# Patient Record
Sex: Female | Born: 1985
Health system: Southern US, Community
[De-identification: ages and names within clinical notes are randomized; demographics above are authoritative.]

## PROBLEM LIST (undated history)

## (undated) DIAGNOSIS — F419 Anxiety disorder, unspecified: Secondary | ICD-10-CM

## (undated) DIAGNOSIS — E559 Vitamin D deficiency, unspecified: Secondary | ICD-10-CM

## (undated) DIAGNOSIS — I10 Essential (primary) hypertension: Secondary | ICD-10-CM

## (undated) DIAGNOSIS — E669 Obesity, unspecified: Secondary | ICD-10-CM

## (undated) DIAGNOSIS — F32A Depression, unspecified: Secondary | ICD-10-CM

## (undated) DIAGNOSIS — F329 Major depressive disorder, single episode, unspecified: Secondary | ICD-10-CM

## (undated) HISTORY — PX: OVARIAN CYST REMOVAL: SHX89

## (undated) HISTORY — DX: Major depressive disorder, single episode, unspecified: F32.9

## (undated) HISTORY — DX: Depression, unspecified: F32.A

## (undated) HISTORY — DX: Anxiety disorder, unspecified: F41.9

## (undated) HISTORY — PX: SPINE SURGERY: SHX786

## (undated) HISTORY — DX: Obesity, unspecified: E66.9

## (undated) HISTORY — DX: Vitamin D deficiency, unspecified: E55.9

---

## 2005-10-05 DIAGNOSIS — L409 Psoriasis, unspecified: Secondary | ICD-10-CM | POA: Insufficient documentation

## 2007-03-07 DIAGNOSIS — J329 Chronic sinusitis, unspecified: Secondary | ICD-10-CM | POA: Insufficient documentation

## 2007-03-08 ENCOUNTER — Emergency Department: Payer: Self-pay | Admitting: Emergency Medicine

## 2008-05-07 ENCOUNTER — Ambulatory Visit: Payer: Self-pay | Admitting: Internal Medicine

## 2008-05-17 ENCOUNTER — Ambulatory Visit: Payer: Self-pay | Admitting: Obstetrics and Gynecology

## 2008-05-18 ENCOUNTER — Ambulatory Visit: Payer: Self-pay | Admitting: Obstetrics and Gynecology

## 2009-07-17 ENCOUNTER — Emergency Department: Payer: Self-pay | Admitting: Emergency Medicine

## 2011-02-02 ENCOUNTER — Ambulatory Visit: Payer: Self-pay | Admitting: Family Medicine

## 2011-04-01 ENCOUNTER — Ambulatory Visit: Payer: Self-pay

## 2011-04-01 LAB — BASIC METABOLIC PANEL
Anion Gap: 9 (ref 7–16)
BUN: 15 mg/dL (ref 7–18)
Chloride: 107 mmol/L (ref 98–107)
Creatinine: 0.85 mg/dL (ref 0.60–1.30)
Potassium: 3.9 mmol/L (ref 3.5–5.1)

## 2011-04-01 LAB — URINALYSIS, COMPLETE
Blood: NEGATIVE
Ketone: NEGATIVE
Leukocyte Esterase: NEGATIVE
Nitrite: NEGATIVE

## 2011-04-01 LAB — CBC WITH DIFFERENTIAL/PLATELET
Basophil #: 0 10*3/uL (ref 0.0–0.1)
Basophil %: 0.5 %
HCT: 40.1 % (ref 35.0–47.0)
HGB: 12.9 g/dL (ref 12.0–16.0)
Lymphocyte #: 1.3 10*3/uL (ref 1.0–3.6)
MCH: 29.1 pg (ref 26.0–34.0)
MCHC: 32.1 g/dL (ref 32.0–36.0)
MCV: 91 fL (ref 80–100)
Monocyte #: 0.4 10*3/uL (ref 0.0–0.7)
Neutrophil #: 2.3 10*3/uL (ref 1.4–6.5)

## 2011-04-01 LAB — LIPASE, BLOOD: Lipase: 143 U/L (ref 73–393)

## 2011-04-03 LAB — URINE CULTURE

## 2011-10-15 ENCOUNTER — Ambulatory Visit: Payer: Self-pay | Admitting: Family Medicine

## 2012-01-29 ENCOUNTER — Other Ambulatory Visit: Payer: Self-pay | Admitting: Family Medicine

## 2012-01-29 LAB — COMPREHENSIVE METABOLIC PANEL
Albumin: 4.3 g/dL (ref 3.4–5.0)
Bilirubin,Total: 0.4 mg/dL (ref 0.2–1.0)
Calcium, Total: 9 mg/dL (ref 8.5–10.1)
Chloride: 106 mmol/L (ref 98–107)
Creatinine: 0.73 mg/dL (ref 0.60–1.30)
EGFR (Non-African Amer.): >60
Glucose: 85 mg/dL (ref 65–99)
Osmolality: 276 (ref 275–301)
Potassium: 3.9 mmol/L (ref 3.5–5.1)
SGOT(AST): 20 U/L (ref 15–37)
Total Protein: 8 g/dL (ref 6.4–8.2)

## 2012-01-29 LAB — CBC WITH DIFFERENTIAL/PLATELET
Basophil %: 0.6 %
HGB: 14.4 g/dL (ref 12.0–16.0)
Lymphocyte #: 0.8 10*3/uL — ABNORMAL LOW (ref 1.0–3.6)
Lymphocyte %: 41.1 %
MCH: 30.3 pg (ref 26.0–34.0)
MCHC: 34.1 g/dL (ref 32.0–36.0)
MCV: 89 fL (ref 80–100)
Monocyte #: 0.2 x10 3/mm (ref 0.2–0.9)
Monocyte %: 10.2 %
Neutrophil #: 0.9 10*3/uL — ABNORMAL LOW (ref 1.4–6.5)
Neutrophil %: 47 %
RBC: 4.74 10*6/uL (ref 3.80–5.20)
RDW: 12.8 % (ref 11.5–14.5)

## 2013-09-12 ENCOUNTER — Ambulatory Visit: Payer: BC Managed Care – PPO | Admitting: Podiatry

## 2013-09-29 ENCOUNTER — Encounter: Payer: Self-pay | Admitting: Podiatry

## 2013-09-29 ENCOUNTER — Ambulatory Visit (INDEPENDENT_AMBULATORY_CARE_PROVIDER_SITE_OTHER): Payer: BC Managed Care – PPO

## 2013-09-29 ENCOUNTER — Ambulatory Visit (INDEPENDENT_AMBULATORY_CARE_PROVIDER_SITE_OTHER): Payer: BC Managed Care – PPO | Admitting: Podiatry

## 2013-09-29 VITALS — BP 145/89 | HR 60 | Resp 16 | Ht 63.0 in | Wt 202.0 lb

## 2013-09-29 DIAGNOSIS — M722 Plantar fascial fibromatosis: Secondary | ICD-10-CM

## 2013-09-29 MED ORDER — TRIAMCINOLONE ACETONIDE 10 MG/ML IJ SUSP
10.0000 mg | Freq: Once | INTRAMUSCULAR | Status: AC
  Administered 2013-09-29: 10 mg

## 2013-09-29 MED ORDER — DICLOFENAC SODIUM 75 MG PO TBEC: 75.0000 mg | DELAYED_RELEASE_TABLET | Freq: Two times a day (BID) | ORAL | Status: DC

## 2013-09-29 NOTE — Progress Notes (Signed)
   Subjective:    Patient ID: Allison Valdez, female    DOB: 06-30-1985, 28 y.o.   MRN: [id number]  HPI Comments: Think i have plantar fasciitis my heel hurts in the morning when i first step down and it hurts so bad after running . It has been about 4or 5 months , it has got worse. It is causing pain in the ankle   Foot Pain Associated symptoms include headaches.      Review of Systems  Neurological: Positive for headaches.  All other systems reviewed and are negative.      Objective:   Physical Exam        Assessment & Plan:

## 2013-09-29 NOTE — Patient Instructions (Signed)

## 2013-09-29 NOTE — Progress Notes (Signed)
Subjective:     Patient ID: Allison Valdez, female   DOB: 1985/01/14, 28 y.o.   MRN: [id number]  Foot Pain   patient presents stating I have a lot of pain in my left heel for the last 4 months. 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      Objective:   Physical Exam  Nursing note and vitals reviewed. Constitutional: She is oriented to person, place, and time.  Cardiovascular: Intact distal pulses.   Musculoskeletal: Normal range of motion.  Neurological: She is oriented to person, place, and time.  Skin: Skin is warm.   neurovascular status intact with muscle strength adequate and range of motion of the subtalar and midtarsal joint. Patient's found to have digits that are well-perfused and range of motion of the first MPJ that adequate. Patient is noted to have exquisite discomfort plantar aspect left heel at the insertion of the tendon into the calcaneus and does have significant depression of the arch upon weightbearing     Assessment:     Severe acute plantar fasciitis left with probable chronic problems with the arch creating stress on the fascia itself    Plan:     H&P and x-rays reviewed. Injected the plantar fascia 3 mg Kenalog 5 mg Xylocaine and applied fascially brace. Prescribed diclofenac 75 mg twice a day and gave instructions on physical therapy

## 2013-10-06 ENCOUNTER — Ambulatory Visit: Payer: BC Managed Care – PPO | Admitting: Podiatry

## 2014-03-14 ENCOUNTER — Ambulatory Visit: Payer: Self-pay | Admitting: Family Medicine

## 2014-06-25 ENCOUNTER — Encounter: Payer: Self-pay | Admitting: *Deleted

## 2014-06-26 ENCOUNTER — Ambulatory Visit (INDEPENDENT_AMBULATORY_CARE_PROVIDER_SITE_OTHER): Payer: BLUE CROSS/BLUE SHIELD | Admitting: *Deleted

## 2014-06-26 VITALS — BP 128/96 | HR 88 | Wt 175.5 lb

## 2014-06-26 DIAGNOSIS — E669 Obesity, unspecified: Secondary | ICD-10-CM

## 2014-06-26 MED ORDER — CYANOCOBALAMIN 1000 MCG/ML IJ SOLN
1000.0000 ug | Freq: Once | INTRAMUSCULAR | Status: AC
  Administered 2014-06-26: 1000 ug via INTRAMUSCULAR

## 2014-07-23 ENCOUNTER — Ambulatory Visit (INDEPENDENT_AMBULATORY_CARE_PROVIDER_SITE_OTHER): Payer: BLUE CROSS/BLUE SHIELD | Admitting: Obstetrics and Gynecology

## 2014-07-23 VITALS — BP 123/83 | HR 67 | Ht 63.0 in | Wt 170.4 lb

## 2014-07-23 DIAGNOSIS — E669 Obesity, unspecified: Secondary | ICD-10-CM

## 2014-07-23 MED ORDER — CYANOCOBALAMIN 1000 MCG/ML IJ SOLN
1000.0000 ug | Freq: Once | INTRAMUSCULAR | Status: AC
  Administered 2014-07-23: 1000 ug via INTRAMUSCULAR

## 2014-07-23 NOTE — Progress Notes (Cosign Needed)
Pt presents today for 1 month f/u on weight, BP, and B-12 injection. Pt denies complaints. Pt given 1 mL injection of cyanocobalamin in the RT deltoid. Pt tolerated well. Pt advised to f/u in 1 month with provider as she is due for a 3 month evaluation of weight loss management.

## 2014-07-23 NOTE — Patient Instructions (Signed)
Pt instructed to f/u in 1 month for 3 month evaluation of weight management.

## 2014-08-07 ENCOUNTER — Ambulatory Visit (INDEPENDENT_AMBULATORY_CARE_PROVIDER_SITE_OTHER): Payer: BLUE CROSS/BLUE SHIELD | Admitting: Obstetrics and Gynecology

## 2014-08-07 ENCOUNTER — Encounter: Payer: Self-pay | Admitting: Obstetrics and Gynecology

## 2014-08-07 VITALS — BP 113/81 | HR 78 | Ht 63.0 in | Wt 172.0 lb

## 2014-08-07 DIAGNOSIS — E559 Vitamin D deficiency, unspecified: Secondary | ICD-10-CM | POA: Diagnosis not present

## 2014-08-07 MED ORDER — PHENTERMINE HCL 37.5 MG PO TABS: 37.5000 mg | ORAL_TABLET | Freq: Every day | ORAL | Status: DC

## 2014-08-07 NOTE — Progress Notes (Signed)
Subjective:     Patient ID: Allison Valdez, female   DOB: [birth year], 29 y.o.   MRN: [id number]  HPI Has been on weight loss plan here since Oct 2015 and lost 30+ lbs  Review of Systems Review of Systems - General ROS: negative    Objective:   Physical Exam A&O x4 Well groomed female in no distress PE not indicated    Assessment:     Obesity Vitamin d deficiency     Plan:     Restart weight loss medications RTC 4 weeks Vit D lab obtained  Yolanda Bonine, CNM

## 2014-08-08 LAB — VITAMIN D 25 HYDROXY (VIT D DEFICIENCY, FRACTURES): VIT D 25 HYDROXY: 27.6 ng/mL — AB (ref 30.0–100.0)

## 2014-08-09 ENCOUNTER — Other Ambulatory Visit: Payer: Self-pay | Admitting: Obstetrics and Gynecology

## 2014-08-09 MED ORDER — ERGOCALCIFEROL 1.25 MG (50000 UT) PO CAPS: 50000.0000 [IU] | ORAL_CAPSULE | ORAL | Status: DC

## 2014-08-10 ENCOUNTER — Telehealth: Payer: Self-pay | Admitting: *Deleted

## 2014-08-10 NOTE — Telephone Encounter (Signed)
-----   Message from Ulyses Amor, [place] sent at 08/09/2014 12:25 PM EDT ----- Please let her know her vit d is still low- recommend weekly supplements which will bring it up faster, x 6 months, then can go to OTC daily. Rx sent in

## 2014-08-10 NOTE — Telephone Encounter (Signed)
Notified pt of lab results, rx sent into pharmacy

## 2014-09-04 ENCOUNTER — Ambulatory Visit: Payer: BLUE CROSS/BLUE SHIELD

## 2014-09-06 ENCOUNTER — Ambulatory Visit (INDEPENDENT_AMBULATORY_CARE_PROVIDER_SITE_OTHER): Payer: BLUE CROSS/BLUE SHIELD | Admitting: Obstetrics and Gynecology

## 2014-09-06 VITALS — BP 123/85 | HR 67 | Ht 63.0 in | Wt 159.4 lb

## 2014-09-06 DIAGNOSIS — E663 Overweight: Secondary | ICD-10-CM

## 2014-09-06 MED ORDER — CYANOCOBALAMIN 1000 MCG/ML IJ SOLN
1000.0000 ug | Freq: Once | INTRAMUSCULAR | Status: AC
  Administered 2014-09-06: 1000 ug via INTRAMUSCULAR

## 2014-09-06 NOTE — Progress Notes (Cosign Needed)
Patient ID: Allison Valdez, female   DOB: 1985-02-13, 29 y.o.   MRN: [id number] Pt has lost 12 lbs since last visit! No problems with medication side effects.

## 2014-10-04 ENCOUNTER — Ambulatory Visit: Payer: BLUE CROSS/BLUE SHIELD

## 2014-10-08 ENCOUNTER — Ambulatory Visit: Payer: BLUE CROSS/BLUE SHIELD

## 2015-02-20 ENCOUNTER — Encounter: Payer: Self-pay | Admitting: Obstetrics and Gynecology

## 2015-02-20 ENCOUNTER — Ambulatory Visit (INDEPENDENT_AMBULATORY_CARE_PROVIDER_SITE_OTHER): Payer: BLUE CROSS/BLUE SHIELD | Admitting: Obstetrics and Gynecology

## 2015-02-20 VITALS — BP 122/70 | HR 62 | Wt 176.2 lb

## 2015-02-20 DIAGNOSIS — E669 Obesity, unspecified: Secondary | ICD-10-CM

## 2015-02-20 MED ORDER — PHENTERMINE HCL 37.5 MG PO TABS: 37.5000 mg | ORAL_TABLET | Freq: Every day | ORAL | Status: DC

## 2015-02-20 MED ORDER — CYANOCOBALAMIN 1000 MCG/ML IJ SOLN: 1000.0000 ug | Freq: Once | INTRAMUSCULAR | Status: DC

## 2015-02-20 MED ORDER — ERGOCALCIFEROL 1.25 MG (50000 UT) PO CAPS: 50000.0000 [IU] | ORAL_CAPSULE | ORAL | Status: DC

## 2015-02-20 NOTE — Progress Notes (Signed)
SUBJECTIVE:  30 y.o. here for follow-up weight loss visit, previously seen 4 weeks ago. Denies any concerns and feels like medication worked well in past, has been off meds since Nov. 2016 and did well until christmas and start of year, has regained 37 # and desires restart meds.  OBJECTIVE:  BP 122/70 mmHg  Pulse 62  Wt 176 lb 3 oz (79.918 kg)  Body mass index is 31.22 kg/(m^2). Patient appears well. ASSESSMENT:  Obesity- responding well to weight loss plan PLAN:  To restart current medications and RX given.  Also want her to restart VitD weekly supplements. B12 1044mcg/ml injection given RTC in 4 weeks as planned  Melody Farwell, CNM

## 2015-02-20 NOTE — Progress Notes (Signed)
Patient ID: Allison Valdez, female   DOB: 1985/07/16, 30 y.o.   MRN: [id number] Pt would like to discuss weight management. Weight gain of 16.7 lbs since 08/2014.

## 2015-02-22 ENCOUNTER — Ambulatory Visit: Payer: BLUE CROSS/BLUE SHIELD | Admitting: Obstetrics and Gynecology

## 2015-03-20 ENCOUNTER — Ambulatory Visit: Payer: BLUE CROSS/BLUE SHIELD

## 2015-10-21 ENCOUNTER — Encounter: Payer: Self-pay | Admitting: Physician Assistant

## 2015-10-21 ENCOUNTER — Ambulatory Visit (INDEPENDENT_AMBULATORY_CARE_PROVIDER_SITE_OTHER): Payer: BLUE CROSS/BLUE SHIELD | Admitting: Physician Assistant

## 2015-10-21 VITALS — BP 122/90 | HR 64 | Temp 98.3°F | Resp 16 | Ht 63.0 in | Wt 190.0 lb

## 2015-10-21 DIAGNOSIS — E6609 Other obesity due to excess calories: Secondary | ICD-10-CM

## 2015-10-21 DIAGNOSIS — Z6831 Body mass index (BMI) 31.0-31.9, adult: Secondary | ICD-10-CM

## 2015-10-21 DIAGNOSIS — Z713 Dietary counseling and surveillance: Secondary | ICD-10-CM | POA: Diagnosis not present

## 2015-10-21 MED ORDER — PHENTERMINE HCL 37.5 MG PO TABS: 37.5000 mg | ORAL_TABLET | Freq: Every day | ORAL | 0 refills | Status: DC

## 2015-10-21 NOTE — Progress Notes (Signed)
Patient: Allison Valdez Female    DOB: May 10, 1985   30 y.o.   MRN: [id number] Visit Date: 10/21/2015  Today's Provider: Margaretann LovelessJennifer M Glynna Failla, PA-C   Chief Complaint  Patient presents with  . Obesity   Subjective:    HPI     Follow up for Obesity  Pt was being followed by OB/GYN for this. Last saw specialist for this problem on 02/20/2015. Was taking Phentermine, which helped pt to lose a total of 50 lbs. Pt reports she has since gained 30 lbs since May. Weighs 190lb today. Admits she stress eats. Tried to eat healthy. Eats breakfast, snack, lunch, snack, incorporates protein into diet. Reports she does "go crazy" with eating when she gets home from work. Likes to snack on potato chips, etc at home. Keeps a 30 YO, and reports she likes to eat the child's snack foods. Is exercising. Typically runs 3-4 times per week. Just participated in a 30 mile event about 3 weeks ago. Has had a H/O obesity. Pt's highest weight was 250 in 2009. Pt states her lowest adult weight was 157. Weight has fluctuated her entire adult life. --------------------------------------------------------------------------------  No Known Allergies   Current Outpatient Prescriptions:  .  levonorgestrel (MIRENA) 20 MCG/24HR IUD, 1 each by Intrauterine route once., Disp: , Rfl:   Review of Systems  Constitutional: Negative.   Respiratory: Negative.   Cardiovascular: Negative.   Gastrointestinal: Negative.   Neurological: Negative.   Psychiatric/Behavioral: Negative.     Social History  Substance Use Topics  . Smoking status: Never Smoker  . Smokeless tobacco: Never Used  . Alcohol use Yes     Comment: occas   Objective:   BP 122/90 (BP Location: Left Arm, Patient Position: Sitting, Cuff Size: Large)   Pulse 64   Temp 98.3 F (36.8 C) (Oral)   Resp 16   Ht 5\' 3"  (1.6 m)   Wt 190 lb (86.2 kg)   BMI 33.66 kg/m   Physical Exam  Constitutional: She appears well-developed and well-nourished.  No distress.  Neck: Normal range of motion. Neck supple. No tracheal deviation present. No thyromegaly present.  Cardiovascular: Normal rate, regular rhythm and normal heart sounds.  Exam reveals no gallop and no friction rub.   No murmur heard. Pulmonary/Chest: Effort normal and breath sounds normal. No respiratory distress. She has no wheezes. She has no rales.  Musculoskeletal: She exhibits no edema.  Lymphadenopathy:    She has no cervical adenopathy.  Skin: She is not diaphoretic.  Psychiatric: She has a normal mood and affect. Her behavior is normal. Judgment and thought content normal.  Vitals reviewed.     Assessment & Plan:     1. Class 1 obesity due to excess calories without serious comorbidity with body mass index (BMI) of 31.0 to 31.9 in adult Has previously used phentermine successfully and kept weight off for 2 years. Recently started regaining weight over last year. She continues to exercise regularly. Does try to eat healthier but is not currently using a food diary. She has used a food diary in the past successfully and states she lost her most amount of weight (almost 100 pounds) using phentermine with a food diary. Will give phentermine for appetite suppression. Start food diary and try to adhere to 1200-1400 calories per day. I will see her back in 4 weeks for weight check. - phentermine (ADIPEX-P) 37.5 MG tablet; Take 1 tablet (37.5 mg total) by mouth daily before breakfast.  Dispense: 30 tablet; Refill: 0  2. Encounter for weight loss counseling See above medical treatment plan.       Margaretann Loveless, PA-C  [facility]

## 2015-10-21 NOTE — Patient Instructions (Signed)

## 2015-11-18 ENCOUNTER — Encounter: Payer: Self-pay | Admitting: Physician Assistant

## 2015-11-18 ENCOUNTER — Ambulatory Visit (INDEPENDENT_AMBULATORY_CARE_PROVIDER_SITE_OTHER): Payer: BLUE CROSS/BLUE SHIELD | Admitting: Physician Assistant

## 2015-11-18 DIAGNOSIS — Z6831 Body mass index (BMI) 31.0-31.9, adult: Secondary | ICD-10-CM

## 2015-11-18 DIAGNOSIS — E6609 Other obesity due to excess calories: Secondary | ICD-10-CM | POA: Diagnosis not present

## 2015-11-18 MED ORDER — PHENTERMINE HCL 37.5 MG PO TABS: 37.5000 mg | ORAL_TABLET | Freq: Every day | ORAL | 0 refills | Status: DC

## 2015-11-18 NOTE — Patient Instructions (Signed)

## 2015-11-18 NOTE — Progress Notes (Signed)
       Patient: Allison Valdez Female    DOB: [birth year]   30 y.o.   MRN: [id number] Visit Date: 11/18/2015  Today's Provider: Margaretann LovelessJennifer M Burnette, PA-C   Chief Complaint  Patient presents with  . Follow-up   Subjective:    HPI  Follow up for weight loss  The patient was last seen for this 4 weeks ago. Changes made at last visit include no changes.  She reports excellent compliance with treatment. She feels that condition is Improved. She is not having side effects.  She was 190 pounds when starting and is down to 182 pounds today.  Current Exercise Habits: Home exercise routine, Type of exercise: walking, Time (Minutes): 45, Frequency (Times/Week): 4, Weekly Exercise (Minutes/Week): 180, Intensity: Mild   ------------------------------------------------------------------------------    No Known Allergies   Current Outpatient Prescriptions:  .  levonorgestrel (MIRENA) 20 MCG/24HR IUD, 1 each by Intrauterine route once., Disp: , Rfl:  .  phentermine (ADIPEX-P) 37.5 MG tablet, Take 1 tablet (37.5 mg total) by mouth daily before breakfast., Disp: 30 tablet, Rfl: 0  Review of Systems  Constitutional: Negative.   Respiratory: Negative.   Cardiovascular: Negative.   Gastrointestinal: Negative.   Psychiatric/Behavioral: Negative.     Social History  Substance Use Topics  . Smoking status: Never Smoker  . Smokeless tobacco: Never Used  . Alcohol use Yes     Comment: occas   Objective:   BP 120/80 (BP Location: Right Arm, Patient Position: Sitting, Cuff Size: Large)   Pulse 68   Temp 98.5 F (36.9 C) (Oral)   Resp 16   Ht 5\' 3"  (1.6 m)   Wt 182 lb (82.6 kg)   SpO2 97%   BMI 32.24 kg/m   Physical Exam  Constitutional: She appears well-developed and well-nourished. No distress.  Neck: Normal range of motion. Neck supple. No tracheal deviation present. No thyromegaly present.  Cardiovascular: Normal rate, regular rhythm and normal heart sounds.  Exam  reveals no gallop and no friction rub.   No murmur heard. Pulmonary/Chest: Effort normal and breath sounds normal. No respiratory distress. She has no wheezes. She has no rales.  Lymphadenopathy:    She has no cervical adenopathy.  Skin: She is not diaphoretic.  Psychiatric: She has a normal mood and affect. Her behavior is normal. Judgment and thought content normal.  Vitals reviewed.     Assessment & Plan:     1. Class 1 obesity due to excess calories without serious comorbidity with body mass index (BMI) of 31.0 to 31.9 in adult Doing very well and down 8 pounds over the last month. Continue exercise. Discussed importance of food diary and she agrees to start one back up. I will see her back in 4 weeks to see how she is progressing. If she is still doing well I will then give her 3 months. - phentermine (ADIPEX-P) 37.5 MG tablet; Take 1 tablet (37.5 mg total) by mouth daily before breakfast.  Dispense: 30 tablet; Refill: 0       Margaretann LovelessJennifer M Burnette, PA-C  [facility]

## 2015-12-16 ENCOUNTER — Ambulatory Visit: Payer: BLUE CROSS/BLUE SHIELD | Admitting: Physician Assistant

## 2015-12-26 ENCOUNTER — Ambulatory Visit: Payer: BLUE CROSS/BLUE SHIELD | Admitting: Physician Assistant

## 2016-02-17 ENCOUNTER — Encounter: Payer: Self-pay | Admitting: Physician Assistant

## 2016-02-17 ENCOUNTER — Ambulatory Visit (INDEPENDENT_AMBULATORY_CARE_PROVIDER_SITE_OTHER): Payer: BLUE CROSS/BLUE SHIELD | Admitting: Physician Assistant

## 2016-02-17 DIAGNOSIS — E6609 Other obesity due to excess calories: Secondary | ICD-10-CM

## 2016-02-17 DIAGNOSIS — Z6831 Body mass index (BMI) 31.0-31.9, adult: Secondary | ICD-10-CM | POA: Diagnosis not present

## 2016-02-17 MED ORDER — PHENTERMINE HCL 37.5 MG PO TABS: 37.5000 mg | ORAL_TABLET | Freq: Every day | ORAL | 0 refills | Status: DC

## 2016-02-17 NOTE — Patient Instructions (Signed)

## 2016-02-17 NOTE — Progress Notes (Signed)
Patient: Allison Valdez Female    DOB: 03-15-1985   31 y.o.   MRN: [id number] Visit Date: 02/17/2016  Today's Provider: Margaretann LovelessJennifer M Burnette, PA-C   Chief Complaint  Patient presents with  . Follow-up   Subjective:    HPI  Follow up for weight loss  The patient was last seen for this 3 months ago. Changes made at last visit include no changes.  She reports poor compliance with treatment. Patient reports that she has been out of medication since December. Patient reports that she does exercise and eats well during work hours then seem to lose control in the evening.  She feels that condition is Worse. She is not having side effects.   She does report that she has been struggling with nighttime snacking. She does still do a food diary but only during the day at work and then she stops when she gets home. She has been continuing to exercise as well. She is doing the 21-day fix exercise DVDs. ------------------------------------------------------------------------------------     No Known Allergies There are no active problems to display for this patient.    Current Outpatient Prescriptions:  .  levonorgestrel (MIRENA) 20 MCG/24HR IUD, 1 each by Intrauterine route once., Disp: , Rfl:  .  phentermine (ADIPEX-P) 37.5 MG tablet, Take 1 tablet (37.5 mg total) by mouth daily before breakfast. (Patient not taking: Reported on 02/17/2016), Disp: 30 tablet, Rfl: 0  Review of Systems  Constitutional: Negative.   Respiratory: Negative.   Cardiovascular: Negative.   Neurological: Negative.     Social History  Substance Use Topics  . Smoking status: Never Smoker  . Smokeless tobacco: Never Used  . Alcohol use Yes     Comment: occas   Objective:   BP 130/80 (BP Location: Left Arm, Patient Position: Sitting, Cuff Size: Large)   Pulse 72   Temp 98.5 F (36.9 C) (Oral)   Resp 16   Ht 5' 2.3" (1.582 m)   Wt 208 lb (94.3 kg)   SpO2 99%   BMI 37.68 kg/m   Physical  Exam  Constitutional: She appears well-developed and well-nourished. No distress.  Neck: Normal range of motion. Neck supple. No tracheal deviation present. No thyromegaly present.  Cardiovascular: Normal rate, regular rhythm and normal heart sounds.  Exam reveals no gallop and no friction rub.   No murmur heard. Pulmonary/Chest: Effort normal and breath sounds normal. No respiratory distress. She has no wheezes. She has no rales.  Musculoskeletal: She exhibits no edema.  Lymphadenopathy:    She has no cervical adenopathy.  Skin: She is not diaphoretic.  Psychiatric: She has a normal mood and affect. Her behavior is normal. Judgment and thought content normal.  Vitals reviewed.     Assessment & Plan:     1. Class 1 obesity due to excess calories without serious comorbidity with body mass index (BMI) of 31.0 to 31.9 in adult Patient did have a 20 pound weight gain being off phentermine for 2 months and through the holidays. Really emphasized for her to try to complete the food diary for the whole day and to try to eat more at work to balance her diet better. Discussed increasing water intake at night to cut appetite. Also discussed setting a hard time like 7pm to not have any food or drink after that time. She agrees. Discussed that phentermine will only help her decrease her portion size and control appetite, that she needs to be strong  with setting lifestyle changes as well or she will continue to gain weight every time she discontinues the medication. She agrees. I will see her back in 4 weeks to check her progress.  - phentermine (ADIPEX-P) 37.5 MG tablet; Take 1 tablet (37.5 mg total) by mouth daily before breakfast.  Dispense: 30 tablet; Refill: 0       Margaretann Loveless, PA-C  [facility]

## 2016-03-16 ENCOUNTER — Encounter: Payer: Self-pay | Admitting: Physician Assistant

## 2016-03-16 ENCOUNTER — Ambulatory Visit (INDEPENDENT_AMBULATORY_CARE_PROVIDER_SITE_OTHER): Payer: BLUE CROSS/BLUE SHIELD | Admitting: Physician Assistant

## 2016-03-16 VITALS — BP 116/84 | HR 77 | Temp 98.2°F | Resp 16 | Ht 62.5 in | Wt 201.0 lb

## 2016-03-16 DIAGNOSIS — E6609 Other obesity due to excess calories: Secondary | ICD-10-CM | POA: Diagnosis not present

## 2016-03-16 DIAGNOSIS — R635 Abnormal weight gain: Secondary | ICD-10-CM | POA: Insufficient documentation

## 2016-03-16 DIAGNOSIS — F419 Anxiety disorder, unspecified: Secondary | ICD-10-CM | POA: Insufficient documentation

## 2016-03-16 DIAGNOSIS — M7631 Iliotibial band syndrome, right leg: Secondary | ICD-10-CM

## 2016-03-16 DIAGNOSIS — F4323 Adjustment disorder with mixed anxiety and depressed mood: Secondary | ICD-10-CM | POA: Insufficient documentation

## 2016-03-16 DIAGNOSIS — Z6831 Body mass index (BMI) 31.0-31.9, adult: Secondary | ICD-10-CM

## 2016-03-16 MED ORDER — PHENTERMINE HCL 37.5 MG PO TABS: 37.5000 mg | ORAL_TABLET | Freq: Every day | ORAL | 2 refills | Status: DC

## 2016-03-16 NOTE — Progress Notes (Signed)
Patient: Allison Valdez Female    DOB: [birth year]   30 y.o.   MRN: [id number] Visit Date: 03/16/2016  Today's Provider: Margaretann LovelessJennifer M Burnette, PA-C   Chief Complaint  Patient presents with  . Obesity  . Hip Pain    right   Subjective:    HPI  Follow up for obesity  The patient was last seen for this 1 months ago. Changes made at last visit include no changes continue phentermine.  She reports excellent compliance with treatment. She feels that condition is Improved. She is not having side effects.  ------------------------------------------------------------------------------------ Patient c/o right hip pain x's 1 week, reports pain is worse after running. Patient reports taking OTC Tylenol and Ibuprofen and reports no improvement with medications.     No Known Allergies   Current Outpatient Prescriptions:  .  levonorgestrel (MIRENA) 20 MCG/24HR IUD, 1 each by Intrauterine route once., Disp: , Rfl:  .  phentermine (ADIPEX-P) 37.5 MG tablet, Take 1 tablet (37.5 mg total) by mouth daily before breakfast., Disp: 30 tablet, Rfl: 0  Review of Systems  Constitutional: Negative.   Respiratory: Negative.   Cardiovascular: Negative.   Gastrointestinal: Negative.   Musculoskeletal: Positive for arthralgias.  Psychiatric/Behavioral: Negative.     Social History  Substance Use Topics  . Smoking status: Never Smoker  . Smokeless tobacco: Never Used  . Alcohol use Yes     Comment: occas   Objective:   BP 116/84 (BP Location: Right Arm, Patient Position: Sitting, Cuff Size: Large)   Pulse 77   Temp 98.2 F (36.8 C) (Oral)   Resp 16   Ht 5' 2.5" (1.588 m)   Wt 201 lb (91.2 kg)   SpO2 98%   BMI 36.18 kg/m  Vitals:   03/16/16 0836  BP: 116/84  Pulse: 77  Resp: 16  Temp: 98.2 F (36.8 C)  TempSrc: Oral  SpO2: 98%  Weight: 201 lb (91.2 kg)  Height: 5' 2.5" (1.588 m)     Physical Exam  Constitutional: She appears well-developed and well-nourished.  No distress.  Neck: Normal range of motion. Neck supple. No JVD present. No tracheal deviation present. No thyromegaly present.  Cardiovascular: Normal rate, regular rhythm and normal heart sounds.  Exam reveals no gallop and no friction rub.   No murmur heard. Pulmonary/Chest: Effort normal and breath sounds normal. No respiratory distress. She has no wheezes. She has no rales.  Musculoskeletal:       Right hip: Normal. She exhibits no tenderness and no bony tenderness.       Right knee: Normal.  Feels pull over right lateral iliac crest and lateral thigh to knee with running; not bothering her now.  Lymphadenopathy:    She has no cervical adenopathy.  Skin: She is not diaphoretic.  Vitals reviewed.     Assessment & Plan:     1. Class 1 obesity due to excess calories without serious comorbidity with body mass index (BMI) of 31.0 to 31.9 in adult She continues to do well and has lost another 7 pounds since last weigh in. She is to continue food diary and exercise. She is doing well. Will continue phentermine for 3 more months and recheck weight in that time frame.  - phentermine (ADIPEX-P) 37.5 MG tablet; Take 1 tablet (37.5 mg total) by mouth daily before breakfast.  Dispense: 30 tablet; Refill: 2  2. It band syndrome, right Discussed stretching, massages and slowing down running when she feels the  pain. May benefit from epsom salt soaks and biofreeze massages.        Margaretann Loveless, PA-C  [facility]

## 2016-03-16 NOTE — Patient Instructions (Signed)
    Iliotibial Band Syndrome Iliotibial band syndrome (ITBS) is a condition that often causes knee pain. It can also cause pain in the outside of your hip, thigh, and knee. The iliotibial band is a strip of tissue that runs from the outside of your hip and down your thigh to the outside of your knee. Repeatedly bending and straightening your knee can irritate the iliotibial band. What are the causes? This condition is caused by inflammation and irritation from the friction of the iliotibial band moving over the thigh bone (femur) when you repeatedly bend and straighten your knee. What increases the risk? This condition is more likely to develop in people who:  Frequently change elevation during their workouts.  Run very long distances.  Recently increased the length or intensity of their workouts.  Run downhill often, or just started running downhill.  Ride a bike very far or often. You may also be at greater risk if you start a new workout routine without first warming up or if you have a job that requires you to bend, squat, or climb frequently. What are the signs or symptoms? Symptoms of this condition include:  Pain along the outside of your knee that may be worse with activity, especially running or going up and down stairs.  A "snapping" sensation over your knee.  Swelling on the outside of your knee.  Pain or a feeling of tightness in your hip. How is this diagnosed? This condition is diagnosed based on your symptoms, medical history, and physical exam. You may also see a health care provider who specializes in reducing pain and increasing mobility (physical therapist). A physical therapist may do an exam to check your balance, movement, and way of walking or running (gait) to see whether the way you move could contribute to your injury. You may also have tests to measure your strength, flexibility, and range of motion. How is this treated? Treatment for this condition  includes:  Resting and limiting exercise.  Returning to activities gradually.  Doing range-of-motion and strengthening exercises (physical therapy) as told by your health care provider.  Including low-impact activities, such as swimming, in your exercise routine. Follow these instructions at home:  If directed, apply ice to the injured area.  Put ice in a plastic bag.  Place a towel between your skin and the bag.  Leave the ice on for 20 minutes, 2-3 times per day.  Return to your normal activities as told by your health care provider. Ask your health care provider what activities are safe for you.  Keep all follow-up visits with your health care provider. This is important. Contact a health care provider if:  Your pain does not improve or gets worse despite treatment. This information is not intended to replace advice given to you by your health care provider. Make sure you discuss any questions you have with your health care provider. Document Released: 06/20/2001 Document Revised: 12/23/2015 Document Reviewed: 05/31/2015 Elsevier Interactive Patient Education  2017 [employer]  

## 2016-06-15 ENCOUNTER — Ambulatory Visit: Payer: BLUE CROSS/BLUE SHIELD | Admitting: Physician Assistant

## 2016-10-06 DIAGNOSIS — L720 Epidermal cyst: Secondary | ICD-10-CM | POA: Diagnosis not present

## 2016-10-26 DIAGNOSIS — L720 Epidermal cyst: Secondary | ICD-10-CM | POA: Diagnosis not present

## 2016-11-10 DIAGNOSIS — Z0000 Encounter for general adult medical examination without abnormal findings: Secondary | ICD-10-CM | POA: Diagnosis not present

## 2016-11-10 DIAGNOSIS — Z6841 Body Mass Index (BMI) 40.0 and over, adult: Secondary | ICD-10-CM | POA: Insufficient documentation

## 2016-11-10 DIAGNOSIS — Z7689 Persons encountering health services in other specified circumstances: Secondary | ICD-10-CM | POA: Diagnosis not present

## 2016-12-16 DIAGNOSIS — Z124 Encounter for screening for malignant neoplasm of cervix: Secondary | ICD-10-CM | POA: Diagnosis not present

## 2016-12-16 DIAGNOSIS — Z6841 Body Mass Index (BMI) 40.0 and over, adult: Secondary | ICD-10-CM | POA: Diagnosis not present

## 2016-12-16 DIAGNOSIS — Z0000 Encounter for general adult medical examination without abnormal findings: Secondary | ICD-10-CM | POA: Diagnosis not present

## 2017-08-05 ENCOUNTER — Encounter: Payer: Self-pay | Admitting: Obstetrics and Gynecology

## 2017-08-05 ENCOUNTER — Ambulatory Visit (INDEPENDENT_AMBULATORY_CARE_PROVIDER_SITE_OTHER): Payer: BLUE CROSS/BLUE SHIELD | Admitting: Obstetrics and Gynecology

## 2017-08-05 VITALS — BP 130/90 | HR 88 | Ht 63.0 in | Wt 230.7 lb

## 2017-08-05 DIAGNOSIS — E6609 Other obesity due to excess calories: Secondary | ICD-10-CM | POA: Diagnosis not present

## 2017-08-05 DIAGNOSIS — M25551 Pain in right hip: Secondary | ICD-10-CM

## 2017-08-05 DIAGNOSIS — M25552 Pain in left hip: Secondary | ICD-10-CM | POA: Diagnosis not present

## 2017-08-05 DIAGNOSIS — Z6831 Body mass index (BMI) 31.0-31.9, adult: Secondary | ICD-10-CM

## 2017-08-05 MED ORDER — PHENTERMINE HCL 37.5 MG PO TABS: 37.5000 mg | ORAL_TABLET | Freq: Every day | ORAL | 2 refills | Status: DC

## 2017-08-05 MED ORDER — CYANOCOBALAMIN 1000 MCG/ML IJ SOLN: 1000.0000 ug | Freq: Once | INTRAMUSCULAR | 1 refills | Status: AC

## 2017-08-05 NOTE — Progress Notes (Signed)
Subjective:  Allison Valdez is a 32 y.o. G0P0000 at Unknown being seen today for weight loss management- initial visit.  Patient reports General ROS: negative and reports previous weight loss attempts:successful until last year when hip pain started.  Associated symptoms include: fatigue, depression, hip and feet pain since gaining weight. Previous/Current treatment includes: small frequent feedings, , vitamin B-12 injections and appetite suppresant.   The patient has a surgical history of: ovarian cyst removed.   Past evaluation has included: metabolic profile, hemoglobin A1c.     The following portions of the patient's history were reviewed and updated as appropriate: allergies, current medications, past family history, past medical history, past social history, past surgical history and problem list.   Objective:   Vitals:   08/05/17 1338  BP: 130/90  Pulse: 88  Weight: 230 lb 11.2 oz (104.6 kg)  Height: 5\' 3"  (1.6 m)    General:  Alert, oriented and cooperative. Patient is in no acute distress.  :   :   :   :   :   :   PE: Well groomed female in no current distress,   Mental Status: Normal mood and affect. Normal behavior. Normal judgment and thought content.   Current BMI: Body mass index is 40.87 kg/m.   Assessment and Plan:  Obesity  There are no diagnoses linked to this encounter.  Plan: low carb, High protein diet RX for adipex 37.5 mg daily and B12 1000mcg.ml monthly, to start now with first injection given at today's visit. Reviewed side-effects common to both medications and expected outcomes. Increase daily water intake to at least 8 bottle a day, every day.  Goal is to reduse weight by 10% by end of three months, and will re-evaluate then.  RTC in 4 weeks for Nurse visit to check weight & BP, and get next B12 injections.    Please refer to After Visit Summary for other counseling recommendations.    [place], Allison Valdez, CNM   Allison Valdez  Allison Valdez, CNM     2. bilateral Hip pain after exercise  Referred to Antoine PrimasZachary Smith in [place] for evaluation   Consider the Low Glycemic Index Diet and 6 smaller meals daily .  This boosts your metabolism and regulates your sugars:   Use the protein bar by Atkins because they have lots of fiber in them  Find the low carb flatbreads, tortillas and pita breads for sandwiches:  Joseph's makes a pita bread and a flat bread , available at [employer] and [employer]; Toufayah makes a low carb flatbread available at [employer] and HT that is 9 net carbs and 100 cal Mission makes a low carb whole wheat tortilla available at [employer],and most grocery stores with 6 net carbs and 210 cal  [place] yogurt can still have a lot of carbs .  Dannon Light Valdez fit has 80 cal and 8 carbs

## 2017-08-05 NOTE — Patient Instructions (Addendum)
low carb, High protein diet RX for adipex 37.5 mg daily and B12 1000mcg.ml monthly, to start now with first injection given at today's visit. Reviewed side-effects common to both medications and expected outcomes. Increase daily water intake to at least 8 bottle a day, every day.  Goal is to reduse weight by 10% by end of three months, and will re-evaluate then.  RTC in 4 weeks for Nurse visit to check weight & BP, and get next B12 injections. .  This boosts your metabolism and regulates your sugars:   Use the protein bar by Atkins because they have lots of fiber in them  Find the low carb flatbreads, tortillas and pita breads for sandwiches:  Joseph's makes a pita bread and a flat bread , available at [facility] and [employer]; Toufayah makes a low carb flatbread available at [employer] and HT that is 9 net carbs and 100 cal Mission makes a low carb whole wheat tortilla available at [employer],and most grocery stores with 6 net carbs and 210 cal  [place] yogurt can still have a lot of carbs .  Dannon Light N fit has 80 cal and 8 carbs

## 2017-08-07 NOTE — Progress Notes (Signed)
[facility] [address] [place], [place] [address] Phone: [phone] Subjective:   Referred by provider Melody Shambley  CC: bilateral hip pain   BJY:NWGNFAOZHY  Allison Valdez is a 32 y.o. female coming in with complaint of bilateral hip pain and feet pain. Chronic hip pain. States that she is a runner but has slowed down a lot due to pain. After exercises she hurts for days after. No numbness and tingling to the toes. Sharp pain that is deep. Last week she has had sharp pains in her lower back. Has tried oral meds for pain.  Patient is having pain that seems to be on the lateral aspect of the hips.  Seems worse after sitting for long amount of time.  Rates the severity pain is 7 out of 10   Patient did have x-rays taken 4 years ago.  These were independently visualized by me showing some mild L5-S1 narrowing but otherwise unremarkable  Past Medical History:  Diagnosis Date  . Anxiety   . Depression   . Obesity   . Vitamin D deficiency disease    Past Surgical History:  Procedure Laterality Date  . OVARIAN CYST REMOVAL Right    Social History   Socioeconomic History  . Marital status: Married    Spouse name: Not on file  . Number of children: Not on file  . Years of education: Not on file  . Highest education level: Not on file  Occupational History  . Not on file  Social Needs  . Financial resource strain: Not on file  . Food insecurity:    Worry: Not on file    Inability: Not on file  . Transportation needs:    Medical: Not on file    Non-medical: Not on file  Tobacco Use  . Smoking status: Never Smoker  . Smokeless tobacco: Never Used  Substance and Sexual Activity  . Alcohol use: Yes    Comment: occas  . Drug use: No  . Sexual activity: Yes    Birth control/protection: IUD    Comment: Mirena  Lifestyle  . Physical activity:    Days per week: Not on file    Minutes per session: Not on file  . Stress: Not on file    Relationships  . Social connections:    Talks on phone: Not on file    Gets together: Not on file    Attends religious service: Not on file    Active member of club or organization: Not on file    Attends meetings of clubs or organizations: Not on file    Relationship status: Not on file  Other Topics Concern  . Not on file  Social History Narrative  . Not on file   No Known Allergies Family History  Problem Relation Age of Onset  . Diabetes Mother   . Diabetes Father      Past medical history, social, surgical and family history all reviewed in electronic medical record.  No pertanent information unless stated regarding to the chief complaint.   Review of Systems:Review of systems updated and as accurate as of 08/09/17  No headache, visual changes, nausea, vomiting, diarrhea, constipation, dizziness, abdominal pain, skin rash, fevers, chills, night sweats, weight loss, swollen lymph nodes, body aches, joint swelling,, chest pain, shortness of breath, mood changes.  Positive muscle aches  Objective  Blood pressure (!) 150/80, pulse 83, height 5\' 3"  (1.6 m), weight 227 lb (103 kg), SpO2 98 %.  Systems examined below as of 08/09/17   General: No apparent distress alert and oriented x3 mood and affect normal, dressed appropriately.  HEENT: Pupils equal, extraocular movements intact  Respiratory: Patient's speak in full sentences and does not appear short of breath  Cardiovascular: No lower extremity edema, non tender, no erythema  Skin: Warm dry intact with no signs of infection or rash on extremities or on axial skeleton.  Abdomen: Soft nontender  Neuro: Cranial nerves II through XII are intact, neurovascularly intact in all extremities with 2+ DTRs and 2+ pulses.  Lymph: No lymphadenopathy of posterior or anterior cervical chain or axillae bilaterally.  Gait normal with good balance and coordination.  MSK:  Non tender with full range of motion and good stability and symmetric  strength and tone of shoulders, elbows, wrist, hip, knee and ankles bilaterally.  Back Exam:  Inspection: Mild loss of lordosis Motion: Flexion 45 deg, Extension 25 deg, Side Bending to 35 deg bilaterally,  Rotation to 35 deg bilaterally  SLR laying: Negative  XSLR laying: Negative  Palpable tenderness: Minimal discomfort over the sacroiliac joint bilaterally.Marland Kitchen. FABER: Tightness bilaterally. Sensory change: Gross sensation intact to all lumbar and sacral dermatomes.  Reflexes: 2+ at both patellar tendons, 2+ at achilles tendons, Babinski's downgoing.  Strength at foot  Plantar-flexion: 5/5 Dorsi-flexion: 5/5 Eversion: 5/5 Inversion: 5/5  Leg strength  Quad: 5/5 Hamstring: 5/5 Hip flexor: 5/5 Hip abductors: 5/5  Gait unremarkable.  [id number]; 15 additional minutes spent for Therapeutic exercises as stated in above notes.  This included exercises focusing on stretching, strengthening, with significant focus on eccentric aspects.   Long term goals include an improvement in range of motion, strength, endurance as well as avoiding reinjury. Patient's frequency would include in 1-2 times a day, 3-5 times a week for a duration of 6-12 weeks.  Low back exercises that included:  Pelvic tilt/bracing instruction to focus on control of the pelvic girdle and lower abdominal muscles  Glute strengthening exercises, focusing on proper firing of the glutes without engaging the low back muscles Proper stretching techniques for maximum relief for the hamstrings, hip flexors, low back and some rotation where tolerated  Proper technique shown and discussed handout in great detail with ATC.  All questions were discussed and answered.      Impression and Recommendations:     This case required medical decision making of moderate complexity.      Note: This dictation was prepared with Dragon dictation along with smaller phrase technology. Any transcriptional errors that result from this process are  unintentional.

## 2017-08-09 ENCOUNTER — Ambulatory Visit (INDEPENDENT_AMBULATORY_CARE_PROVIDER_SITE_OTHER)
Admission: RE | Admit: 2017-08-09 | Discharge: 2017-08-09 | Disposition: A | Discharge status: Home / Self Care | Payer: BLUE CROSS/BLUE SHIELD | Source: Ambulatory Visit | Attending: Family Medicine | Admitting: Family Medicine

## 2017-08-09 ENCOUNTER — Encounter: Payer: Self-pay | Admitting: Family Medicine

## 2017-08-09 ENCOUNTER — Ambulatory Visit (INDEPENDENT_AMBULATORY_CARE_PROVIDER_SITE_OTHER): Payer: BLUE CROSS/BLUE SHIELD | Admitting: Family Medicine

## 2017-08-09 VITALS — BP 150/80 | HR 83 | Ht 63.0 in | Wt 227.0 lb

## 2017-08-09 DIAGNOSIS — M545 Low back pain, unspecified: Secondary | ICD-10-CM | POA: Insufficient documentation

## 2017-08-09 DIAGNOSIS — G8929 Other chronic pain: Secondary | ICD-10-CM

## 2017-08-09 DIAGNOSIS — M549 Dorsalgia, unspecified: Secondary | ICD-10-CM

## 2017-08-09 DIAGNOSIS — M47816 Spondylosis without myelopathy or radiculopathy, lumbar region: Secondary | ICD-10-CM | POA: Diagnosis not present

## 2017-08-09 MED ORDER — GABAPENTIN 100 MG PO CAPS: 200.0000 mg | ORAL_CAPSULE | Freq: Every day | ORAL | 3 refills | Status: DC

## 2017-08-09 MED ORDER — VITAMIN D (ERGOCALCIFEROL) 1.25 MG (50000 UNIT) PO CAPS: 50000.0000 [IU] | ORAL_CAPSULE | ORAL | 0 refills | Status: DC

## 2017-08-09 NOTE — Assessment & Plan Note (Signed)
I believe the patient's pain is secondary to more of a lumbar radiculopathy with some radiation going down the leg.  Patient does not have it all the time and a negative straight leg test but patient does have some discomfort over the lumbosacral area bilaterally.  Some mild to moderate discomfort also over the paraspinal musculature lumbar spine.  Differential includes a bilateral greater trochanteric bursitis but patient's tightness could be more secondary to hip flexor tendinitis.  We discussed an adjustable standing desk.  We discussed icing regimen, we discussed home exercises.  Patient is to increase activity slowly over the course the next 4 weeks.  Work with Event organiserathletic trainer to learn home exercises.  Follow-up again in 4 to 6 weeks

## 2017-08-09 NOTE — Patient Instructions (Signed)
Good to see you Xrays downstairs Ice 20 minutes 2 times daily. Usually after activity and before bed. Exercises 3 times a week.   Once weekly vitamin D for 12 weeks Gabapentin 200mg  at night OK to bike, elliptical or swim.  See me again in 4 weeks

## 2017-08-30 ENCOUNTER — Encounter: Payer: Self-pay | Admitting: Obstetrics and Gynecology

## 2017-08-30 ENCOUNTER — Ambulatory Visit (INDEPENDENT_AMBULATORY_CARE_PROVIDER_SITE_OTHER): Payer: BLUE CROSS/BLUE SHIELD | Admitting: Obstetrics and Gynecology

## 2017-08-30 VITALS — BP 138/89 | HR 85 | Ht 63.0 in | Wt 217.8 lb

## 2017-08-30 DIAGNOSIS — M25551 Pain in right hip: Secondary | ICD-10-CM

## 2017-08-30 DIAGNOSIS — M25552 Pain in left hip: Secondary | ICD-10-CM

## 2017-08-30 DIAGNOSIS — E6609 Other obesity due to excess calories: Secondary | ICD-10-CM

## 2017-08-30 DIAGNOSIS — Z6831 Body mass index (BMI) 31.0-31.9, adult: Principal | ICD-10-CM

## 2017-08-30 MED ORDER — CYANOCOBALAMIN 1000 MCG/ML IJ SOLN
1000.0000 ug | Freq: Once | INTRAMUSCULAR | Status: AC
  Administered 2017-08-30: 1000 ug via INTRAMUSCULAR

## 2017-08-30 NOTE — Progress Notes (Signed)
Pt presents for wt, bp, and b12 inj. Weight is down 13#. No s/e noted. Pt is eating healthy. Unable to exercise currently d/t bilateral hip and foot pain. Has an appt with sports medicine soon. F/u in 4 weeks.

## 2017-09-08 ENCOUNTER — Ambulatory Visit: Payer: BLUE CROSS/BLUE SHIELD | Admitting: Family Medicine

## 2017-09-14 ENCOUNTER — Encounter: Payer: Self-pay | Admitting: Family Medicine

## 2017-09-21 ENCOUNTER — Encounter: Payer: Self-pay | Admitting: Family Medicine

## 2017-09-21 ENCOUNTER — Ambulatory Visit (INDEPENDENT_AMBULATORY_CARE_PROVIDER_SITE_OTHER): Payer: BLUE CROSS/BLUE SHIELD | Admitting: Family Medicine

## 2017-09-21 DIAGNOSIS — M4306 Spondylolysis, lumbar region: Secondary | ICD-10-CM | POA: Diagnosis not present

## 2017-09-21 DIAGNOSIS — M7062 Trochanteric bursitis, left hip: Secondary | ICD-10-CM

## 2017-09-21 DIAGNOSIS — M7061 Trochanteric bursitis, right hip: Secondary | ICD-10-CM | POA: Insufficient documentation

## 2017-09-21 NOTE — Patient Instructions (Signed)
Good to see you  Injected the side of the hips today  Continue the exercises, icing and the vitamins Write me in 2 weeks and tell me how you are doing I think the elliptical should be good. See me again in 4 weeks

## 2017-09-21 NOTE — Assessment & Plan Note (Signed)
Bilateral injections given today.  Discussed icing regimen and home exercise.  Discussed topical anti-inflammatories.  Discussed gabapentin.  Once weekly vitamin D and encouraged to continue.  The patient is worsening symptoms I believe that advanced imaging was warranted.  Hopefully the patient does make improvement with the greater trochanteric injections.  Follow-up again in 4 to 6 weeks

## 2017-09-21 NOTE — Progress Notes (Signed)
[facility] [address] [place], [place] [address] Phone: [phone] Subjective:    I Allison Valdez am serving as a Neurosurgeon for Dr. Antoine Primas.   CC: Bilateral hip pain and back pain follow-up  BJY:NWGNFAOZHY  Allison Valdez is a 31 y.o. female coming in with complaint of hip pain. States that her hips hurt today. She's noticed in the last month she's had to move very slow through back flexion and extension.  Patient was seen previously.  Was having more bilateral hip pain.  X-rays of the back were taken and independently visualized by me showing a pars defect.  Patient has been doing home exercises and icing regimen with very minimal improvement.  Trying to work out and lose weight but is finding it difficult.      Past Medical History:  Diagnosis Date  . Anxiety   . Depression   . Obesity   . Vitamin D deficiency disease    Past Surgical History:  Procedure Laterality Date  . OVARIAN CYST REMOVAL Right    Social History   Socioeconomic History  . Marital status: Married    Spouse name: Not on file  . Number of children: Not on file  . Years of education: Not on file  . Highest education level: Not on file  Occupational History  . Not on file  Social Needs  . Financial resource strain: Not on file  . Food insecurity:    Worry: Not on file    Inability: Not on file  . Transportation needs:    Medical: Not on file    Non-medical: Not on file  Tobacco Use  . Smoking status: Never Smoker  . Smokeless tobacco: Never Used  Substance and Sexual Activity  . Alcohol use: Yes    Comment: occas  . Drug use: No  . Sexual activity: Yes    Birth control/protection: IUD    Comment: Mirena  Lifestyle  . Physical activity:    Days per week: Not on file    Minutes per session: Not on file  . Stress: Not on file  Relationships  . Social connections:    Talks on phone: Not on file    Gets together: Not on file    Attends religious  service: Not on file    Active member of club or organization: Not on file    Attends meetings of clubs or organizations: Not on file    Relationship status: Not on file  Other Topics Concern  . Not on file  Social History Narrative  . Not on file   No Known Allergies Family History  Problem Relation Age of Onset  . Diabetes Mother   . Diabetes Father     Current Outpatient Medications (Endocrine & Metabolic):  .  levonorgestrel (MIRENA) 20 MCG/24HR IUD, 1 each by Intrauterine route once.     Current Outpatient Medications (Hematological):  .  cyanocobalamin (,VITAMIN B-12,) 1000 MCG/ML injection, INJECT INTO THE MUSCLE ONCE FOR 1 DOSE.  Current Outpatient Medications (Other):  .  gabapentin (NEURONTIN) 100 MG capsule, Take 2 capsules (200 mg total) by mouth at bedtime. .  phentermine (ADIPEX-P) 37.5 MG tablet, Take 1 tablet (37.5 mg total) by mouth daily before breakfast. .  Vitamin D, Ergocalciferol, (DRISDOL) 50000 units CAPS capsule, Take 1 capsule (50,000 Units total) by mouth every 7 (seven) days.    Past medical history, social, surgical and family history all reviewed in electronic medical  record.  No pertanent information unless stated regarding to the chief complaint.   Review of Systems:  No headache, visual changes, nausea, vomiting, diarrhea, constipation, dizziness, abdominal pain, skin rash, fevers, chills, night sweats, weight loss, swollen lymph nodes, body aches, joint swelling,  chest pain, shortness of breath, mood changes.  Positive muscle aches  Objective  Blood pressure 140/90, pulse 82, height 5\' 3"  (1.6 m), weight 215 lb (97.5 kg), SpO2 (!) 89 %.    General: No apparent distress alert and oriented x3 mood and affect normal, dressed appropriately.  HEENT: Pupils equal, extraocular movements intact  Respiratory: Patient's speak in full sentences and does not appear short of breath  Cardiovascular: No lower extremity edema, non tender, no erythema   Skin: Warm dry intact with no signs of infection or rash on extremities or on axial skeleton.  Abdomen: Soft nontender  Neuro: Cranial nerves II through XII are intact, neurovascularly intact in all extremities with 2+ DTRs and 2+ pulses.  Lymph: No lymphadenopathy of posterior or anterior cervical chain or axillae bilaterally.  Gait normal with good balance and coordination.  MSK:  Non tender with full range of motion and good stability and symmetric strength and tone of shoulders, elbows, wrist, knee and ankles bilaterally.  Back Exam:  Inspection: Loss of lordosis Motion: Flexion 45 deg, Extension 45 deg, Side Bending to 45 deg bilaterally,  Rotation to 45 deg bilaterally  SLR laying: Negative  XSLR laying: Negative  Palpable tenderness: Severe tenderness over the greater trochanteric area bilaterally. FABER: Severe tightness of the [facility] test bilaterally. Sensory change: Gross sensation intact to all lumbar and sacral dermatomes.  Reflexes: 2+ at both patellar tendons, 2+ at achilles tendons, Babinski's downgoing.  Strength at foot  Plantar-flexion: 5/5 Dorsi-flexion: 5/5 Eversion: 5/5 Inversion: 5/5  Leg strength  Quad: 5/5 Hamstring: 5/5 Hip flexor: 5/5 Hip abductors: 4/5 but symmetric   Procedure: Injection of right greater trochanteric bursitis secondary to patient's body habitus Verbal informed consent obtained.  Time-out conducted.  Noted no overlying erythema, induration, or other signs of local infection.  Skin prepped in a sterile fashion.  Local anesthesia: Topical Ethyl chloride.  With sterile technique and under real time ultrasound guidance:  Greater trochanteric area was visualized and patient's bursa was noted. A 22-gauge 3 inch needle was inserted and 4 cc of 0.5% Marcaine and 1 cc of Kenalog 40 mg/dL was injected. Pictures taken Completed without difficulty  Pain immediately resolved suggesting accurate placement of the medication.  Advised to call if  fevers/chills, erythema, induration, drainage, or persistent bleeding.  Impression: Technically successful injection.   Procedure:  Injection of left  greater trochanteric bursitis secondary to patient's body habitus  Verbal informed consent obtained.  Time-out conducted.  Noted no overlying erythema, induration, or other signs of local infection.  Skin prepped in a sterile fashion.  Local anesthesia: Topical Ethyl chloride.  With sterile technique and under real time ultrasound guidance:  Greater trochanteric area was visualized and patient's bursa was noted. A 22-gauge 3 inch needle was inserted and 4 cc of 0.5% Marcaine and 1 cc of Kenalog 40 mg/dL was injected.  Completed without difficulty  Pain immediately resolved suggesting accurate placement of the medication.  Advised to call if fevers/chills, erythema, induration, drainage, or persistent bleeding.  Impression: Technically successfulinjection.   Impression and Recommendations:     This case required medical decision making of moderate complexity. The above documentation has been reviewed and is accurate and complete Judi Saa, DO  Note: This dictation was prepared with Dragon dictation along with smaller phrase technology. Any transcriptional errors that result from this process are unintentional.

## 2017-09-21 NOTE — Assessment & Plan Note (Signed)
Discussed posture and ergonomics and home exercises.  Discussed gabapentin again.  See how patient responds to the injections

## 2017-09-27 ENCOUNTER — Ambulatory Visit: Payer: BLUE CROSS/BLUE SHIELD | Admitting: Family Medicine

## 2017-09-27 ENCOUNTER — Ambulatory Visit (INDEPENDENT_AMBULATORY_CARE_PROVIDER_SITE_OTHER): Payer: BLUE CROSS/BLUE SHIELD | Admitting: Obstetrics and Gynecology

## 2017-09-27 VITALS — BP 163/114 | HR 89 | Ht 63.0 in | Wt 204.0 lb

## 2017-09-27 DIAGNOSIS — E6609 Other obesity due to excess calories: Secondary | ICD-10-CM

## 2017-09-27 DIAGNOSIS — Z6831 Body mass index (BMI) 31.0-31.9, adult: Secondary | ICD-10-CM

## 2017-09-27 MED ORDER — CYANOCOBALAMIN 1000 MCG/ML IJ SOLN
1000.0000 ug | Freq: Once | INTRAMUSCULAR | Status: AC
  Administered 2017-09-27: 1000 ug via INTRAMUSCULAR

## 2017-09-27 NOTE — Progress Notes (Signed)
Pt presents for wt, bp, and b12 inj. Weight is down 11#. No s/e noted. Pt is eating healthy. Exercising as tolerated due to hip pain. F/u in 4 weeks.

## 2017-10-04 ENCOUNTER — Encounter: Payer: Self-pay | Admitting: Family Medicine

## 2017-10-18 ENCOUNTER — Ambulatory Visit: Payer: BLUE CROSS/BLUE SHIELD | Admitting: Family Medicine

## 2017-10-25 ENCOUNTER — Ambulatory Visit: Payer: BLUE CROSS/BLUE SHIELD | Admitting: Obstetrics and Gynecology

## 2017-11-02 ENCOUNTER — Ambulatory Visit: Payer: BLUE CROSS/BLUE SHIELD | Admitting: Obstetrics and Gynecology

## 2017-11-02 ENCOUNTER — Encounter: Payer: Self-pay | Admitting: Obstetrics and Gynecology

## 2017-11-02 DIAGNOSIS — E6609 Other obesity due to excess calories: Secondary | ICD-10-CM

## 2017-11-02 DIAGNOSIS — Z6831 Body mass index (BMI) 31.0-31.9, adult: Principal | ICD-10-CM

## 2017-11-02 MED ORDER — CYANOCOBALAMIN 1000 MCG/ML IJ SOLN: INTRAMUSCULAR | 1 refills | Status: DC

## 2017-11-02 MED ORDER — PHENTERMINE HCL 37.5 MG PO TABS: 37.5000 mg | ORAL_TABLET | Freq: Every day | ORAL | 2 refills | Status: DC

## 2017-11-02 NOTE — Progress Notes (Signed)
Pt is here for wt, bp check, b-12 inj She is doing well, denies any s/e  11/02/17 wt- 203.3lb 09/27/17 wt- 204lb  Waist 37 in   Is down 31# since first visit for weight loss in July. States she is currently getting injections in both hips and low back pain is better. Been using the eliptical machine.  Doing well with water intake.   Will add yoga/pilates and/or weights when on eliptical machine.   Goal for the next month is to lose 4-5 lbs.   Melody Shambley,CNM

## 2017-11-17 DIAGNOSIS — J019 Acute sinusitis, unspecified: Secondary | ICD-10-CM | POA: Diagnosis not present

## 2017-11-17 DIAGNOSIS — B9689 Other specified bacterial agents as the cause of diseases classified elsewhere: Secondary | ICD-10-CM | POA: Diagnosis not present

## 2017-11-17 DIAGNOSIS — J209 Acute bronchitis, unspecified: Secondary | ICD-10-CM | POA: Diagnosis not present

## 2017-11-25 ENCOUNTER — Encounter: Payer: BLUE CROSS/BLUE SHIELD | Admitting: Obstetrics and Gynecology

## 2017-12-02 ENCOUNTER — Encounter: Payer: BLUE CROSS/BLUE SHIELD | Admitting: Obstetrics and Gynecology

## 2017-12-08 ENCOUNTER — Encounter: Payer: BLUE CROSS/BLUE SHIELD | Admitting: Obstetrics and Gynecology

## 2017-12-14 ENCOUNTER — Encounter: Payer: Self-pay | Admitting: *Deleted

## 2017-12-15 ENCOUNTER — Encounter: Payer: BLUE CROSS/BLUE SHIELD | Admitting: Obstetrics and Gynecology

## 2017-12-27 ENCOUNTER — Encounter: Payer: Self-pay | Admitting: Obstetrics and Gynecology

## 2017-12-27 ENCOUNTER — Ambulatory Visit (INDEPENDENT_AMBULATORY_CARE_PROVIDER_SITE_OTHER): Payer: BLUE CROSS/BLUE SHIELD | Admitting: Obstetrics and Gynecology

## 2017-12-27 VITALS — BP 140/102 | HR 98 | Ht 63.0 in | Wt 202.6 lb

## 2017-12-27 DIAGNOSIS — E6609 Other obesity due to excess calories: Secondary | ICD-10-CM

## 2017-12-27 DIAGNOSIS — Z01419 Encounter for gynecological examination (general) (routine) without abnormal findings: Secondary | ICD-10-CM | POA: Diagnosis not present

## 2017-12-27 DIAGNOSIS — Z30431 Encounter for routine checking of intrauterine contraceptive device: Secondary | ICD-10-CM

## 2017-12-27 DIAGNOSIS — Z6831 Body mass index (BMI) 31.0-31.9, adult: Secondary | ICD-10-CM

## 2017-12-27 DIAGNOSIS — E559 Vitamin D deficiency, unspecified: Secondary | ICD-10-CM | POA: Diagnosis not present

## 2017-12-27 NOTE — Patient Instructions (Addendum)
Preventive Care 18-39 Years, Female Preventive care refers to lifestyle choices and visits with your health care provider that can promote health and wellness. What does preventive care include?  A yearly physical exam. This is also called an annual well check.  Dental exams once or twice a year.  Routine eye exams. Ask your health care provider how often you should have your eyes checked.  Personal lifestyle choices, including: ? Daily care of your teeth and gums. ? Regular physical activity. ? Eating a healthy diet. ? Avoiding tobacco and drug use. ? Limiting alcohol use. ? Practicing safe sex. ? Taking vitamin and mineral supplements as recommended by your health care provider. What happens during an annual well check? The services and screenings done by your health care provider during your annual well check will depend on your age, overall health, lifestyle risk factors, and family history of disease. Counseling Your health care provider may ask you questions about your:  Alcohol use.  Tobacco use.  Drug use.  Emotional well-being.  Home and relationship well-being.  Sexual activity.  Eating habits.  Work and work Statistician.  Method of birth control.  Menstrual cycle.  Pregnancy history.  Screening You may have the following tests or measurements:  Height, weight, and BMI.  Diabetes screening. This is done by checking your blood sugar (glucose) after you have not eaten for a while (fasting).  Blood pressure.  Lipid and cholesterol levels. These may be checked every 5 years starting at age 78.  Skin check.  Hepatitis C blood test.  Hepatitis B blood test.  Sexually transmitted disease (STD) testing.  BRCA-related cancer screening. This may be done if you have a family history of breast, ovarian, tubal, or peritoneal cancers.  Pelvic exam and Pap test. This may be done every 3 years starting at age 2. Starting at age 61, this may be done  every 5 years if you have a Pap test in combination with an HPV test.  Discuss your test results, treatment options, and if necessary, the need for more tests with your health care provider. Vaccines Your health care provider may recommend certain vaccines, such as:  Influenza vaccine. This is recommended every year.  Tetanus, diphtheria, and acellular pertussis (Tdap, Td) vaccine. You may need a Td booster every 10 years.  Varicella vaccine. You may need this if you have not been vaccinated.  HPV vaccine. If you are 59 or younger, you may need three doses over 6 months.  Measles, mumps, and rubella (MMR) vaccine. You may need at least one dose of MMR. You may also need a second dose.  Pneumococcal 13-valent conjugate (PCV13) vaccine. You may need this if you have certain conditions and were not previously vaccinated.  Pneumococcal polysaccharide (PPSV23) vaccine. You may need one or two doses if you smoke cigarettes or if you have certain conditions.  Meningococcal vaccine. One dose is recommended if you are age 28-21 years and a first-year college student living in a residence hall, or if you have one of several medical conditions. You may also need additional booster doses.  Hepatitis A vaccine. You may need this if you have certain conditions or if you travel or work in places where you may be exposed to hepatitis A.  Hepatitis B vaccine. You may need this if you have certain conditions or if you travel or work in places where you may be exposed to hepatitis B.  Haemophilus influenzae type b (Hib) vaccine. You may need this  if you have certain risk factors.  Talk to your health care provider about which screenings and vaccines you need and how often you need them. This information is not intended to replace advice given to you by your health care provider. Make sure you discuss any questions you have with your health care provider. Document Released: 02/24/2001 Document Revised:  09/18/2015 Document Reviewed: 10/30/2014 Elsevier Interactive Patient Education  2018 [employer].  Vitamin D Deficiency Vitamin D deficiency is when your body does not have enough vitamin D. Vitamin D is important to your body for many reasons:  It helps the body to absorb two important minerals, called calcium and phosphorus.  It plays a role in bone health.  It may help to prevent some diseases, such as diabetes and multiple sclerosis.  It plays a role in muscle function, including heart function.  You can get vitamin D by:  Eating foods that naturally contain vitamin D.  Eating or drinking milk or other dairy products that have vitamin D added to them.  Taking a vitamin D supplement or a multivitamin supplement that contains vitamin D.  Being in the sun. Your body naturally makes vitamin D when your skin is exposed to sunlight. Your body changes the sunlight into a form of the vitamin that the body can use.  If vitamin D deficiency is severe, it can cause a condition in which your bones become soft. In adults, this condition is called osteomalacia. In children, this condition is called rickets. What are the causes? Vitamin D deficiency may be caused by:  Not eating enough foods that contain vitamin D.  Not getting enough sun exposure.  Having certain digestive system diseases that make it difficult for your body to absorb vitamin D. These diseases include Crohn disease, chronic pancreatitis, and cystic fibrosis.  Having a surgery in which a part of the stomach or a part of the small intestine is removed.  Being obese.  Having chronic kidney disease or liver disease.  What increases the risk? This condition is more likely to develop in:  Older people.  People who do not spend much time outdoors.  People who live in a long-term care facility.  People who have had broken bones.  People with weak or thin bones (osteoporosis).  People who have a disease or  condition that changes how the body absorbs vitamin D.  People who have dark skin.  People who take certain medicines, such as steroid medicines or certain seizure medicines.  People who are overweight or obese.  What are the signs or symptoms? In mild cases of vitamin D deficiency, there may not be any symptoms. If the condition is severe, symptoms may include:  Bone pain.  Muscle pain.  Falling often.  Broken bones caused by a minor injury.  How is this diagnosed? This condition is usually diagnosed with a blood test. How is this treated? Treatment for this condition may depend on what caused the condition. Treatment options include:  Taking vitamin D supplements.  Taking a calcium supplement. Your health care provider will suggest what dose is best for you.  Follow these instructions at home:  Take medicines and supplements only as told by your health care provider.  Eat foods that contain vitamin D. Choices include: ? Fortified dairy products, cereals, or juices. Fortified means that vitamin D has been added to the food. Check the label on the package to be sure. ? Fatty fish, such as salmon or trout. ? Eggs. ?  Oysters.  Do not use a tanning bed.  Maintain a healthy weight. Lose weight, if needed.  Keep all follow-up visits as told by your health care provider. This is important. Contact a health care provider if:  Your symptoms do not go away.  You feel like throwing up (nausea) or you throw up (vomit).  You have fewer bowel movements than usual or it is difficult for you to have a bowel movement (constipation). This information is not intended to replace advice given to you by your health care provider. Make sure you discuss any questions you have with your health care provider. Document Released: 03/23/2011 Document Revised: 06/12/2015 Document Reviewed: 05/16/2014 Elsevier Interactive Patient Education  2018 [employer].

## 2017-12-27 NOTE — Progress Notes (Signed)
Waist: 35 in. °

## 2017-12-27 NOTE — Progress Notes (Signed)
Subjective:   Allison Valdez is a 32 y.o. G0P0000 Caucasian female here for a routine well-woman exam.  No LMP recorded. (Menstrual status: IUD).    Current complaints: none, is still exercising 4 days a week and watching what she is eating. Doesn't notice increased energy level as much as when she first started it. Is getting standing desks at work.  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 Tobacco/alcohol: no tobacco use Illicit drugs: no history of illicit drug use  The following portions of the patient's history were reviewed and updated as appropriate: allergies, current medications, past family history, past medical history, past social history, past surgical history and problem list.  Past Medical History Past Medical History:  Diagnosis Date  . Anxiety   . Depression   . Obesity   . Vitamin D deficiency disease     Past Surgical History Past Surgical History:  Procedure Laterality Date  . OVARIAN CYST REMOVAL Right     Gynecologic History G0P0000  No LMP recorded. (Menstrual status: IUD). Contraception: IUD Last Pap: >2018. Results were: normal   Obstetric History OB History  Gravida Para Term Preterm AB Living  0 0 0 0 0 0  SAB TAB Ectopic Multiple Live Births  0 0 0 0      Current Medications Current Outpatient Medications on File Prior to Visit  Medication Sig Dispense Refill  . cyanocobalamin (,VITAMIN B-12,) 1000 MCG/ML injection INJECT INTO THE MUSCLE ONCE FOR 1 DOSE. 1 mL 1  . gabapentin (NEURONTIN) 100 MG capsule Take 2 capsules (200 mg total) by mouth at bedtime. 60 capsule 3  . levonorgestrel (MIRENA) 20 MCG/24HR IUD 1 each by Intrauterine route once.    . phentermine (ADIPEX-P) 37.5 MG tablet Take 1 tablet (37.5 mg total) by mouth daily before breakfast. 30 tablet 2  . Vitamin D, Ergocalciferol, (DRISDOL) 50000  units CAPS capsule Take 1 capsule (50,000 Units total) by mouth every 7 (seven) days. (Patient not taking: Reported on 12/27/2017) 12 capsule 0   No current facility-administered medications on file prior to visit.     Review of Systems Patient denies any headaches, blurred vision, shortness of breath, chest pain, abdominal pain, problems with bowel movements, urination, or intercourse.  Objective:  BP (!) 140/102   Pulse 98   Ht 5\' 3"  (1.6 m)   Wt 202 lb 9.6 oz (91.9 kg)   BMI 35.89 kg/m  Physical Exam  General:  Well developed, well nourished, no acute distress. She is alert and oriented x3. Skin:  Warm and dry Neck:  Midline trachea, no thyromegaly or nodules Cardiovascular: Regular rate and rhythm, no murmur heard Lungs:  Effort normal, all lung fields clear to auscultation bilaterally Breasts:  No dominant palpable mass, retraction, or nipple discharge Abdomen:  Soft, non tender, no hepatosplenomegaly or masses Pelvic:  External genitalia is normal in appearance.  The vagina is normal in appearance. The cervix is bulbous, no CMT.  Thin prep pap is done with HR HPV cotesting. Uterus is felt to be normal size, shape, and contour.  No adnexal masses or tenderness noted. IUD string noted. Extremities:  No swelling or varicosities noted Psych:  She has a normal mood and affect  Assessment:   Healthy well-woman exam IUD check(placed 5 years ago) Obesity Vit D deficiency  Plan:  Labs obtained-will follow up accordingly. F/U  1 year for AE, or sooner if needed Also needs weight check in 4 weeks. Discussed need for at least 5% weight loss (10 lbs) in the next 2 months.   Allison Valdez [name], CNM

## 2018-01-03 LAB — PAP IG AND HPV HIGH-RISK: HPV, high-risk: POSITIVE — AB

## 2018-01-06 ENCOUNTER — Encounter: Payer: Self-pay | Admitting: Family Medicine

## 2018-01-06 ENCOUNTER — Ambulatory Visit (INDEPENDENT_AMBULATORY_CARE_PROVIDER_SITE_OTHER): Payer: BLUE CROSS/BLUE SHIELD | Admitting: Family Medicine

## 2018-01-06 ENCOUNTER — Other Ambulatory Visit: Payer: Self-pay

## 2018-01-06 VITALS — BP 122/90 | HR 75 | Temp 98.1°F | Ht 63.0 in | Wt 203.2 lb

## 2018-01-06 DIAGNOSIS — R6889 Other general symptoms and signs: Secondary | ICD-10-CM

## 2018-01-06 LAB — POCT INFLUENZA A/B
INFLUENZA A, POC: NEGATIVE
INFLUENZA B, POC: NEGATIVE

## 2018-01-06 MED ORDER — HYDROCODONE-HOMATROPINE 5-1.5 MG/5ML PO SYRP: 5.0000 mL | ORAL_SOLUTION | Freq: Four times a day (QID) | ORAL | 0 refills | Status: AC | PRN

## 2018-01-06 NOTE — Patient Instructions (Signed)
Discussed use of Mucinex. Let me know if not improving over the next few day.

## 2018-01-06 NOTE — Progress Notes (Signed)
  Subjective:     Patient ID: Allison Valdez, female   DOB: Nov 21, 1985, 32 y.o.   MRN: [id number] Chief Complaint  Patient presents with  . flu like symptoms    01/04/18 started.  had fever this morning of 101 took ibuprofen   HPI States her niece has been diagnosed with the flu and her husband was sick before her. He accompanies her today also with flu-like sx. No flu shot is season Review of Systems     Objective:   Physical Exam Constitutional:      General: She is not in acute distress.    Appearance: She is ill-appearing.  Neurological:     Mental Status: She is alert.   Ears: T.M's intact without inflammation Throat: no tonsillar enlargement or exudate Neck: no cervical adenopathy Lungs: clear     Assessment:    1. Flu-like symptoms - POCT Influenza A/B - HYDROcodone-homatropine (HYCODAN) 5-1.5 MG/5ML syrup; Take 5 mLs by mouth every 6 (six) hours as needed for up to 5 days. 5 ml 4-6 hours as needed for cough  Dispense: 100 mL; Refill: 0    Plan:    Discussed use of Mucinex. Work excuse for 01/07/18 provided.

## 2018-01-31 ENCOUNTER — Ambulatory Visit (INDEPENDENT_AMBULATORY_CARE_PROVIDER_SITE_OTHER): Payer: BLUE CROSS/BLUE SHIELD | Admitting: Family Medicine

## 2018-01-31 ENCOUNTER — Encounter: Payer: Self-pay | Admitting: Family Medicine

## 2018-01-31 VITALS — BP 132/102 | HR 77 | Ht 63.0 in | Wt 205.0 lb

## 2018-01-31 DIAGNOSIS — M545 Low back pain, unspecified: Secondary | ICD-10-CM

## 2018-01-31 DIAGNOSIS — M4306 Spondylolysis, lumbar region: Secondary | ICD-10-CM

## 2018-01-31 NOTE — Progress Notes (Signed)
[facility] [address] [place], [place] [address] Phone: [phone] Subjective:   Bruce Donath, am serving as a scribe for Dr. Antoine Primas.  I'm seeing this patient by the request  of:  CC: Back pain follow-up  [id number]  Allison Valdez is a 33 y.o. female coming in with complaint of left hip pain and lower back pain. Feels a catching sensation in the left lower back for 2 months. Patient continues to have pain over the lateral aspect of hip. Walked 2 miles last week and states that she didn't sleep for 2 days. Intensity of her pain changes. Has tried the elliptical and this aggravated her hip as well. Is not using gabapentin currently. Did not feel as if it made a difference.  Patient states that she cannot do anything more than her daily activities without severe pain in the back with some mild radiation down the leg.    Patient did have x-rays taken in July 2019.  Found to have spondylosis at L4 with a right-sided pars defect.  Past Medical History:  Diagnosis Date  . Anxiety   . Depression   . Obesity   . Vitamin D deficiency disease    Past Surgical History:  Procedure Laterality Date  . OVARIAN CYST REMOVAL Right    Social History   Socioeconomic History  . Marital status: Married    Spouse name: Not on file  . Number of children: Not on file  . Years of education: Not on file  . Highest education level: Not on file  Occupational History  . Not on file  Social Needs  . Financial resource strain: Not on file  . Food insecurity:    Worry: Not on file    Inability: Not on file  . Transportation needs:    Medical: Not on file    Non-medical: Not on file  Tobacco Use  . Smoking status: Never Smoker  . Smokeless tobacco: Never Used  Substance and Sexual Activity  . Alcohol use: Yes    Comment: occas  . Drug use: No  . Sexual activity: Yes    Birth control/protection: I.U.D.    Comment: Mirena  Lifestyle  .  Physical activity:    Days per week: Not on file    Minutes per session: Not on file  . Stress: Not on file  Relationships  . Social connections:    Talks on phone: Not on file    Gets together: Not on file    Attends religious service: Not on file    Active member of club or organization: Not on file    Attends meetings of clubs or organizations: Not on file    Relationship status: Not on file  Other Topics Concern  . Not on file  Social History Narrative  . Not on file   No Known Allergies Family History  Problem Relation Age of Onset  . Diabetes Mother   . Diabetes Father     Current Outpatient Medications (Endocrine & Metabolic):  .  levonorgestrel (MIRENA) 20 MCG/24HR IUD, 1 each by Intrauterine route once.     Current Outpatient Medications (Hematological):  .  cyanocobalamin (,VITAMIN B-12,) 1000 MCG/ML injection, INJECT INTO THE MUSCLE ONCE FOR 1 DOSE.  Current Outpatient Medications (Other):  .  phentermine (ADIPEX-P) 37.5 MG tablet, Take 1 tablet (37.5 mg total) by mouth daily before breakfast.    Past medical history, social, surgical and family history all  reviewed in electronic medical record.  No pertanent information unless stated regarding to the chief complaint.   Review of Systems:  No headache, visual changes, nausea, vomiting, diarrhea, constipation, dizziness, abdominal pain, skin rash, fevers, chills, night sweats, weight loss, swollen lymph nodes, body aches, joint swelling, , chest pain, shortness of breath, mood changes.  Positive muscle aches  Objective  Blood pressure (!) 132/102, pulse 77, height 5\' 3"  (1.6 m), weight 205 lb (93 kg), SpO2 99 %.    General: No apparent distress alert and oriented x3 mood and affect normal, dressed appropriately.  HEENT: Pupils equal, extraocular movements intact  Respiratory: Patient's speak in full sentences and does not appear short of breath  Cardiovascular: No lower extremity edema, non tender, no  erythema  Skin: Warm dry intact with no signs of infection or rash on extremities or on axial skeleton.  Abdomen: Soft nontender  Neuro: Cranial nerves II through XII are intact, neurovascularly intact in all extremities with 2+ DTRs and 2+ pulses.  Lymph: No lymphadenopathy of posterior or anterior cervical chain or axillae bilaterally.  Gait mild antalgic.  MSK:  Non tender with full range of motion and good stability and symmetric strength and tone of shoulders, elbows, wrist, hip, knee and ankles bilaterally.  Back Exam:  Inspection: Loss of lordosis Motion: Flexion 40 deg, Extension 25 deg, Side Bending to 35 deg bilaterally,  Rotation to 35 deg bilaterally  SLR laying: Positive straight leg on the left tightness in the hamstring on the right XSLR laying: Positive left Palpable tenderness: Tender to palpation of the paraspinal musculature lumbar spine right greater than left. FABER: Tightness on the left. Sensory change: Gross sensation intact to all lumbar and sacral dermatomes.  Reflexes: 2+ at both patellar tendons, 2+ at achilles tendons, Babinski's downgoing.  Strength at foot  Patient does have some mild weakness of 4-5 strength of the left leg compared to the contralateral side.  New symptom   Impression and Recommendations:     This case required medical decision making of moderate complexity. The above documentation has been reviewed and is accurate and complete Allison Saa, DO       Note: This dictation was prepared with Dragon dictation along with smaller phrase technology. Any transcriptional errors that result from this process are unintentional.

## 2018-01-31 NOTE — Patient Instructions (Addendum)
Good to see you  Ice 20 minutes 2 times daily. Usually after activity and before bed.  We will get MRI  Call [phone] to schedule.  If I see a nerve being pinched then will likely recommend injection and woul want to see you again 3 weeks after the injection  I will write you after MRI and discuss from there.

## 2018-01-31 NOTE — Assessment & Plan Note (Signed)
Patient does have a pars defect and is having more radicular symptoms.  Failed all conservative therapy including formal physical therapy, home exercise, medications as well over the course of several years now.  Patient is negatives worsening.  We will get an MRI at this time.  Patient would be a candidate for possible injections.  Discussed icing regimen and home exercise.  Follow-up again in 4 to 8 weeks.

## 2018-02-02 ENCOUNTER — Ambulatory Visit
Admission: RE | Admit: 2018-02-02 | Discharge: 2018-02-02 | Disposition: A | Discharge status: Home / Self Care | Payer: BLUE CROSS/BLUE SHIELD | Source: Ambulatory Visit | Attending: Family Medicine | Admitting: Family Medicine

## 2018-02-02 DIAGNOSIS — M5136 Other intervertebral disc degeneration, lumbar region: Secondary | ICD-10-CM | POA: Diagnosis not present

## 2018-02-02 DIAGNOSIS — M545 Low back pain, unspecified: Secondary | ICD-10-CM

## 2018-02-03 ENCOUNTER — Encounter: Payer: Self-pay | Admitting: Family Medicine

## 2018-02-03 DIAGNOSIS — M5416 Radiculopathy, lumbar region: Secondary | ICD-10-CM

## 2018-02-09 ENCOUNTER — Ambulatory Visit: Payer: BLUE CROSS/BLUE SHIELD | Admitting: Obstetrics and Gynecology

## 2018-02-10 ENCOUNTER — Other Ambulatory Visit: Payer: Self-pay

## 2018-02-10 NOTE — Progress Notes (Unsigned)
IMG 

## 2018-02-15 ENCOUNTER — Ambulatory Visit
Admission: RE | Admit: 2018-02-15 | Discharge: 2018-02-15 | Disposition: A | Discharge status: Home / Self Care | Payer: BLUE CROSS/BLUE SHIELD | Source: Ambulatory Visit | Attending: Family Medicine | Admitting: Family Medicine

## 2018-02-15 DIAGNOSIS — M5416 Radiculopathy, lumbar region: Secondary | ICD-10-CM

## 2018-02-15 DIAGNOSIS — M47817 Spondylosis without myelopathy or radiculopathy, lumbosacral region: Secondary | ICD-10-CM | POA: Diagnosis not present

## 2018-02-15 MED ORDER — METHYLPREDNISOLONE ACETATE 40 MG/ML INJ SUSP (RADIOLOG
120.0000 mg | Freq: Once | INTRAMUSCULAR | Status: AC
  Administered 2018-02-15: 120 mg via EPIDURAL

## 2018-02-15 MED ORDER — IOPAMIDOL (ISOVUE-M 200) INJECTION 41%
1.0000 mL | Freq: Once | INTRAMUSCULAR | Status: AC
  Administered 2018-02-15: 1 mL via EPIDURAL

## 2018-02-15 NOTE — Discharge Instructions (Signed)

## 2018-02-24 ENCOUNTER — Ambulatory Visit: Payer: BLUE CROSS/BLUE SHIELD | Admitting: Obstetrics and Gynecology

## 2018-03-02 ENCOUNTER — Ambulatory Visit (INDEPENDENT_AMBULATORY_CARE_PROVIDER_SITE_OTHER): Payer: BLUE CROSS/BLUE SHIELD | Admitting: Obstetrics and Gynecology

## 2018-03-02 ENCOUNTER — Encounter: Payer: Self-pay | Admitting: Obstetrics and Gynecology

## 2018-03-02 VITALS — BP 138/102 | HR 97 | Ht 63.0 in | Wt 204.4 lb

## 2018-03-02 DIAGNOSIS — I10 Essential (primary) hypertension: Secondary | ICD-10-CM

## 2018-03-02 DIAGNOSIS — Z30433 Encounter for removal and reinsertion of intrauterine contraceptive device: Secondary | ICD-10-CM

## 2018-03-02 MED ORDER — AMLODIPINE BESYLATE 5 MG PO TABS: 5.0000 mg | ORAL_TABLET | Freq: Every day | ORAL | 6 refills | Status: DC

## 2018-03-02 NOTE — Patient Instructions (Addendum)
Hypertension Hypertension, commonly called high blood pressure, is when the force of blood pumping through the arteries is too strong. The arteries are the blood vessels that carry blood from the heart throughout the body. Hypertension forces the heart to work harder to pump blood and may cause arteries to become narrow or stiff. Having untreated or uncontrolled hypertension can cause heart attacks, strokes, kidney disease, and other problems. A blood pressure reading consists of a higher number over a lower number. Ideally, your blood pressure should be below 120/80. The first ("top") number is called the systolic pressure. It is a measure of the pressure in your arteries as your heart beats. The second ("bottom") number is called the diastolic pressure. It is a measure of the pressure in your arteries as the heart relaxes. What are the causes? The cause of this condition is not known. What increases the risk? Some risk factors for high blood pressure are under your control. Others are not. Factors you can change  Smoking.  Having type 2 diabetes mellitus, high cholesterol, or both.  Not getting enough exercise or physical activity.  Being overweight.  Having too much fat, sugar, calories, or salt (sodium) in your diet.  Drinking too much alcohol. Factors that are difficult or impossible to change  Having chronic kidney disease.  Having a family history of high blood pressure.  Age. Risk increases with age.  Race. You may be at higher risk if you are African-American.  Gender. Men are at higher risk than women before age 45. After age 65, women are at higher risk than men.  Having obstructive sleep apnea.  Stress. What are the signs or symptoms? Extremely high blood pressure (hypertensive crisis) may cause:  Headache.  Anxiety.  Shortness of breath.  Nosebleed.  Nausea and vomiting.  Severe chest pain.  Jerky movements you cannot control (seizures). How is this  diagnosed? This condition is diagnosed by measuring your blood pressure while you are seated, with your arm resting on a surface. The cuff of the blood pressure monitor will be placed directly against the skin of your upper arm at the level of your heart. It should be measured at least twice using the same arm. Certain conditions can cause a difference in blood pressure between your right and left arms. Certain factors can cause blood pressure readings to be lower or higher than normal (elevated) for a short period of time:  When your blood pressure is higher when you are in a health care provider's office than when you are at home, this is called white coat hypertension. Most people with this condition do not need medicines.  When your blood pressure is higher at home than when you are in a health care provider's office, this is called masked hypertension. Most people with this condition may need medicines to control blood pressure. If you have a high blood pressure reading during one visit or you have normal blood pressure with other risk factors:  You may be asked to return on a different day to have your blood pressure checked again.  You may be asked to monitor your blood pressure at home for 1 week or longer. If you are diagnosed with hypertension, you may have other blood or imaging tests to help your health care provider understand your overall risk for other conditions. How is this treated? This condition is treated by making healthy lifestyle changes, such as eating healthy foods, exercising more, and reducing your alcohol intake. Your health care provider   may prescribe medicine if lifestyle changes are not enough to get your blood pressure under control, and if:  Your systolic blood pressure is above 130.  Your diastolic blood pressure is above 80. Your personal target blood pressure may vary depending on your medical conditions, your age, and other factors. Follow these instructions  at home: Eating and drinking   Eat a diet that is high in fiber and potassium, and low in sodium, added sugar, and fat. An example eating plan is called the DASH (Dietary Approaches to Stop Hypertension) diet. To eat this way: ? Eat plenty of fresh fruits and vegetables. Try to fill half of your plate at each meal with fruits and vegetables. ? Eat whole grains, such as whole wheat pasta, brown rice, or whole grain bread. Fill about one quarter of your plate with whole grains. ? Eat or drink low-fat dairy products, such as skim milk or low-fat yogurt. ? Avoid fatty cuts of meat, processed or cured meats, and poultry with skin. Fill about one quarter of your plate with lean proteins, such as fish, chicken without skin, beans, eggs, and tofu. ? Avoid premade and processed foods. These tend to be higher in sodium, added sugar, and fat.  Reduce your daily sodium intake. Most people with hypertension should eat less than 1,500 mg of sodium a day.  Limit alcohol intake to no more than 1 drink a day for nonpregnant women and 2 drinks a day for men. One drink equals 12 oz of beer, 5 oz of wine, or 1 oz of hard liquor. Lifestyle   Work with your health care provider to maintain a healthy body weight or to lose weight. Ask what an ideal weight is for you.  Get at least 30 minutes of exercise that causes your heart to beat faster (aerobic exercise) most days of the week. Activities may include walking, swimming, or biking.  Include exercise to strengthen your muscles (resistance exercise), such as pilates or lifting weights, as part of your weekly exercise routine. Try to do these types of exercises for 30 minutes at least 3 days a week.  Do not use any products that contain nicotine or tobacco, such as cigarettes and e-cigarettes. If you need help quitting, ask your health care provider.  Monitor your blood pressure at home as told by your health care provider.  Keep all follow-up visits as told by  your health care provider. This is important. Medicines  Take over-the-counter and prescription medicines only as told by your health care provider. Follow directions carefully. Blood pressure medicines must be taken as prescribed.  Do not skip doses of blood pressure medicine. Doing this puts you at risk for problems and can make the medicine less effective.  Ask your health care provider about side effects or reactions to medicines that you should watch for. Contact a health care provider if:  You think you are having a reaction to a medicine you are taking.  You have headaches that keep coming back (recurring).  You feel dizzy.  You have swelling in your ankles.  You have trouble with your vision. Get help right away if:  You develop a severe headache or confusion.  You have unusual weakness or numbness.  You feel faint.  You have severe pain in your chest or abdomen.  You vomit repeatedly.  You have trouble breathing. Summary  Hypertension is when the force of blood pumping through your arteries is too strong. If this condition is not controlled, it   may put you at risk for serious complications.  Your personal target blood pressure may vary depending on your medical conditions, your age, and other factors. For most people, a normal blood pressure is less than 120/80.  Hypertension is treated with lifestyle changes, medicines, or a combination of both. Lifestyle changes include weight loss, eating a healthy, low-sodium diet, exercising more, and limiting alcohol. This information is not intended to replace advice given to you by your health care provider. Make sure you discuss any questions you have with your health care provider. Document Released: 12/29/2004 Document Revised: 11/27/2015 Document Reviewed: 11/27/2015 Elsevier Interactive Patient Education  2019 [employer]. IUD PLACEMENT POST-PROCEDURE INSTRUCTIONS  1. You may take Ibuprofen, Aleve or Tylenol for  pain if needed.  Cramping should resolve within in 24 hours.  2. You may have a small amount of spotting.  You should wear a mini pad for the next few days.  3. You may have intercourse after 24 hours.  If you using this for birth control, it is effective immediately.  4. You need to call if you have any pelvic pain, fever, heavy bleeding or foul smelling vaginal discharge.  Irregular bleeding is common the first several months after having an IUD placed. You do not need to call for this reason unless you are concerned.  5. Shower or bathe as normal  6. You should have a follow-up appointment in 4-8 weeks for a re-check to make sure you are not having any problems.

## 2018-03-02 NOTE — Progress Notes (Signed)
Allison Valdez is a 33 y.o. year old [id number] Caucasian female who presents for removal and replacement of a Mirena IUD. She was given informed consent for removal and reinsertion of her Mirena. Her Mirena was placed 2014, No LMP recorded. (Menstrual status: IUD)., and her pregnancy test today was negative.   The risks and benefits of the method and placement have been thouroughly reviewed with the patient and all questions were answered.  Specifically the patient is aware of failure rate of 01/998, expulsion of the IUD and of possible perforation.  The patient is aware of irregular bleeding due to the method and understands the incidence of irregular bleeding diminishes with time.  Signed copy of informed consent in chart.   No LMP recorded. (Menstrual status: IUD). BP (!) 138/102   Pulse 97   Ht 5\' 3"  (1.6 m)   Wt 204 lb 6.4 oz (92.7 kg)   BMI 36.21 kg/m  No results found for this or any previous visit (from the past 24 hour(s)).   Appropriate time out taken. A pederson speculum was placed in the vagina.  The cervix was visualized, prepped using Betadine. The strings were visible. They were grasped and the Mirena was easily removed. The cervix was then grasped with a single-tooth tenaculum. The uterus was found to be neutral and it sounded to 8 cm.  Mirena IUD placed per manufacturer's recommendations without complications. The strings were trimmed to 3 cm.  The patient tolerated the procedure well.   The patient was given post procedure instructions, including signs and symptoms of infection and to check for the strings after each menses or each month, and refraining from intercourse or anything in the vagina for 3 days.  She was given a Mirena care card with date Mirena placed, and date Mirena to be removed.  Patient was also started on norvasc 5 mg daily for elevated blood pressure- to return next week for BP check.  Shizuye Rupert Suzan Nailer, CNM

## 2018-03-07 NOTE — Progress Notes (Deleted)
[facility] [address] [place], [place] [address] Phone: [phone] Subjective:    I'm seeing this patient by the request  of:    CC: back pain follow-up  ERD:[id number]  Allison Valdez is a 33 y.o. female coming in with complaint of ***  Onset-  Location Duration-  Character- Aggravating factors- Reliving factors-  Therapies tried-  Severity-   Patient was seen back MRIs show degenerative change in the lower lumbar spine showing a moderate L4-L5 bilateral neuronal foraminal narrowing.  Patient did had a epidural injection at L4-L5 on February 15, 2018.  Past Medical History:  Diagnosis Date  . Anxiety   . Depression   . Obesity   . Vitamin D deficiency disease    Past Surgical History:  Procedure Laterality Date  . OVARIAN CYST REMOVAL Right    Social History   Socioeconomic History  . Marital status: Married    Spouse name: Not on file  . Number of children: Not on file  . Years of education: Not on file  . Highest education level: Not on file  Occupational History  . Not on file  Social Needs  . Financial resource strain: Not on file  . Food insecurity:    Worry: Not on file    Inability: Not on file  . Transportation needs:    Medical: Not on file    Non-medical: Not on file  Tobacco Use  . Smoking status: Never Smoker  . Smokeless tobacco: Never Used  Substance and Sexual Activity  . Alcohol use: Yes    Comment: occas  . Drug use: No  . Sexual activity: Yes    Birth control/protection: I.U.D.    Comment: Mirena  Lifestyle  . Physical activity:    Days per week: Not on file    Minutes per session: Not on file  . Stress: Not on file  Relationships  . Social connections:    Talks on phone: Not on file    Gets together: Not on file    Attends religious service: Not on file    Active member of club or organization: Not on file    Attends meetings of clubs or organizations: Not on file    Relationship  status: Not on file  Other Topics Concern  . Not on file  Social History Narrative  . Not on file   No Known Allergies Family History  Problem Relation Age of Onset  . Diabetes Mother   . Diabetes Father     Current Outpatient Medications (Endocrine & Metabolic):  .  levonorgestrel (MIRENA) 20 MCG/24HR IUD, 1 each by Intrauterine route once.  Current Outpatient Medications (Cardiovascular):  .  amLODipine (NORVASC) 5 MG tablet, Take 1 tablet (5 mg total) by mouth daily.    Current Outpatient Medications (Hematological):  .  cyanocobalamin (,VITAMIN B-12,) 1000 MCG/ML injection, INJECT INTO THE MUSCLE ONCE FOR 1 DOSE. (Patient not taking: Reported on 03/02/2018)  Current Outpatient Medications (Other):  .  phentermine (ADIPEX-P) 37.5 MG tablet, Take 1 tablet (37.5 mg total) by mouth daily before breakfast. (Patient not taking: Reported on 03/02/2018)    Past medical history, social, surgical and family history all reviewed in electronic medical record.  No pertanent information unless stated regarding to the chief complaint.   Review of Systems:  No headache, visual changes, nausea, vomiting, diarrhea, constipation, dizziness, abdominal pain, skin rash, fevers, chills, night sweats, weight loss, swollen lymph nodes, body aches, joint  swelling, muscle aches, chest pain, shortness of breath, mood changes.   Objective  There were no vitals taken for this visit. Systems examined below as of    General: No apparent distress alert and oriented x3 mood and affect normal, dressed appropriately.  HEENT: Pupils equal, extraocular movements intact  Respiratory: Patient's speak in full sentences and does not appear short of breath  Cardiovascular: No lower extremity edema, non tender, no erythema  Skin: Warm dry intact with no signs of infection or rash on extremities or on axial skeleton.  Abdomen: Soft nontender  Neuro: Cranial nerves II through XII are intact, neurovascularly  intact in all extremities with 2+ DTRs and 2+ pulses.  Lymph: No lymphadenopathy of posterior or anterior cervical chain or axillae bilaterally.  Gait normal with good balance and coordination.  MSK:  Non tender with full range of motion and good stability and symmetric strength and tone of shoulders, elbows, wrist, hip, knee and ankles bilaterally.  Back Exam:  Inspection: Unremarkable  Motion: Flexion 45 deg, Extension 45 deg, Side Bending to 45 deg bilaterally,  Rotation to 45 deg bilaterally  SLR laying: Negative  XSLR laying: Negative  Palpable tenderness: None. FABER: negative. Sensory change: Gross sensation intact to all lumbar and sacral dermatomes.  Reflexes: 2+ at both patellar tendons, 2+ at achilles tendons, Babinski's downgoing.  Strength at foot  Plantar-flexion: 5/5 Dorsi-flexion: 5/5 Eversion: 5/5 Inversion: 5/5  Leg strength  Quad: 5/5 Hamstring: 5/5 Hip flexor: 5/5 Hip abductors: 5/5  Gait unremarkable.   Impression and Recommendations:     This case required medical decision making of moderate complexity. The above documentation has been reviewed and is accurate and complete Judi Saa, DO       Note: This dictation was prepared with Dragon dictation along with smaller phrase technology. Any transcriptional errors that result from this process are unintentional.

## 2018-03-08 ENCOUNTER — Ambulatory Visit: Payer: BLUE CROSS/BLUE SHIELD | Admitting: Family Medicine

## 2018-04-12 ENCOUNTER — Encounter: Payer: BLUE CROSS/BLUE SHIELD | Admitting: Obstetrics and Gynecology

## 2018-04-13 ENCOUNTER — Encounter: Payer: BLUE CROSS/BLUE SHIELD | Admitting: Obstetrics and Gynecology

## 2018-04-14 ENCOUNTER — Other Ambulatory Visit: Payer: Self-pay

## 2018-04-14 ENCOUNTER — Ambulatory Visit (INDEPENDENT_AMBULATORY_CARE_PROVIDER_SITE_OTHER): Payer: BLUE CROSS/BLUE SHIELD | Admitting: Obstetrics and Gynecology

## 2018-04-14 ENCOUNTER — Encounter: Payer: Self-pay | Admitting: Obstetrics and Gynecology

## 2018-04-14 VITALS — BP 134/86 | HR 98 | Ht 63.0 in | Wt 204.0 lb

## 2018-04-14 DIAGNOSIS — Z30431 Encounter for routine checking of intrauterine contraceptive device: Secondary | ICD-10-CM

## 2018-04-14 DIAGNOSIS — I10 Essential (primary) hypertension: Secondary | ICD-10-CM | POA: Diagnosis not present

## 2018-04-14 MED ORDER — HYDROCHLOROTHIAZIDE 25 MG PO TABS: 25.0000 mg | ORAL_TABLET | Freq: Every day | ORAL | 6 refills | Status: DC

## 2018-04-14 NOTE — Progress Notes (Signed)
  Subjective:     Patient ID: Allison Valdez, female   DOB: [birth year], 33 y.o.   MRN: [id number]  HPI To discuss replacement of IUD in Feb. Denies any concerns, not seeing spotting or cramping. Is happy with it and desires to continue use. Also to discuss BP readings. States it is better, and is having less headaches. States home BP 120-135/90s. Is still taking BP medication.  Is currently working from home due to [employer] and does feel more stressed.  Review of Systems  All other systems reviewed and are negative.      Objective:   Physical Exam Blood pressure 134/86, pulse 98, height 5\' 3"  (1.6 m), weight 204 lb (92.5 kg). Per patient    Assessment:     IUD check hypertension    Plan:     Will continue Norvasc and add HCTZ 25mg  daily. To check BP at home 2-3 times a week for the next month and MyChart me the numbers. Will follow up as needed.   Melody Shambley,CNM

## 2018-08-19 ENCOUNTER — Other Ambulatory Visit: Payer: Self-pay

## 2018-08-19 ENCOUNTER — Encounter: Payer: Self-pay | Admitting: Family Medicine

## 2018-08-19 ENCOUNTER — Ambulatory Visit (INDEPENDENT_AMBULATORY_CARE_PROVIDER_SITE_OTHER): Payer: BC Managed Care – PPO | Admitting: Family Medicine

## 2018-08-19 VITALS — BP 140/90 | HR 81 | Ht 63.0 in | Wt 201.0 lb

## 2018-08-19 DIAGNOSIS — M549 Dorsalgia, unspecified: Secondary | ICD-10-CM | POA: Diagnosis not present

## 2018-08-19 DIAGNOSIS — G8929 Other chronic pain: Secondary | ICD-10-CM

## 2018-08-19 DIAGNOSIS — M4306 Spondylolysis, lumbar region: Secondary | ICD-10-CM

## 2018-08-19 DIAGNOSIS — M999 Biomechanical lesion, unspecified: Secondary | ICD-10-CM | POA: Insufficient documentation

## 2018-08-19 NOTE — Progress Notes (Signed)
Allison Valdez D.O. [facility] [address] [place], [place] [address] Phone: [phone] Subjective:   I, Allison Valdez, am serving as a scribe for Dr. Tyjae Valdez.   CC: Back pain follow-up  HPI:Subjective  Allison Valdez is a 33 y.o. female coming in with complaint of back pain. Epidural given in February. States she is feeling sore.  Patient did have a pars defect with foraminal narrowing bilaterally.  Patient states that she was feeling 100% better but now just has more of a soreness.  Patient is wanting to be more active and run but feels like she is still unable to do so.  Patient denies any other radiation down the legs anymore.  States that she can sleep much more comfortably as well.  She tries to increase activity though more soreness occurs again.  Denies any fevers chills or any abnormal weight loss     Past Medical History:  Diagnosis Date  . Anxiety   . Depression   . Obesity   . Vitamin D deficiency disease    Past Surgical History:  Procedure Laterality Date  . OVARIAN CYST REMOVAL Right    Social History   Socioeconomic History  . Marital status: Married    Spouse name: Not on file  . Number of children: Not on file  . Years of education: Not on file  . Highest education level: Not on file  Occupational History  . Not on file  Social Needs  . Financial resource strain: Not on file  . Food insecurity    Worry: Not on file    Inability: Not on file  . Transportation needs    Medical: Not on file    Non-medical: Not on file  Tobacco Use  . Smoking status: Never Smoker  . Smokeless tobacco: Never Used  Substance and Sexual Activity  . Alcohol use: Yes    Comment: occas  . Drug use: No  . Sexual activity: Yes   TD1330[place] X-P) 37.5 MG tablet, Take 1 tablet (37.5 mg total) by mouth daily before breakfast.    Past medical  history, social, surgical and family history all reviewed in electronic medical record.  No pertanent information unless stated regarding to the chief complaint.   Review of Systems:  No headache, visual changes, nausea, vomiting, diarrhea, constipation, dizziness, abdominal pain, skin rash, fevers, chills, night sweats, weight loss, swollen lymph nodes, body aches, joint swelling, chest pain, shortness of breath, mood changes.  Positive muscle aches  Objective  Blood pressure 140/90, pulse 81, height 5\' 3"  (1.6 m), weight 201 lb (91.2 kg), SpO2 98 %.    General: No apparent distress alert and oriented x3 mood and affect normal, dressed appropriately.  HEENT: Pupils equal, extraocular movements intact  Respiratory: Patient's speak in full sentences and does not appear short of  breath  Cardiovascular: No lower extremity edema, non tender, no erythema  Skin: Warm dry intact with no signs of infection or rash on extremities or on axial skeleton.  Abdomen: Soft nontender  Neuro: Cranial nerves II through XII are intact, neurovascularly intact in all extremities with 2+ DTRs and 2+ pulses.  Lymph: No lymphadenopathy of posterior or anterior cervical chain or axillae bilaterally.  Gait normal with good balance and coordination.  MSK:  tender with limited range of motion and good stability and symmetric strength and tone of shoulders, elbows, wrist, hip, knee and ankles bilaterally.  Back exam has some mild loss of lordosis.  Tender to palpation of paraspinal musculature lumbar spine right greater than left.  Worsening pain with extension lacking last 10 degrees.  Patient does have tightness with flexion as well.  Tightness with Corky Sox test bilaterally but negative straight leg test.  Osteopathic findings  C6 flexed rotated and side bent left T5 extended rotated and side bent left L4 flexed rotated and side bent left Sacrum right on right     Impression and Recommendations:     This case required medical decision making of moderate complexity. The above documentation has been reviewed and is accurate and complete Allison Pulley, DO       Note: This dictation was prepared with Dragon dictation along with smaller phrase technology. Any transcriptional errors that result from this process are unintentional.

## 2018-08-19 NOTE — Patient Instructions (Addendum)
Start a walk-run progression:  Once you have reached 30 mins: ONLY 3 TIMES a WEEK  - Run 2 mins, then walk 1 min. Week 1 -Then run 3 mins, and walk 1 min. Week 2 -Then run 4 mins, and walk 1 min. Week 3  -Then run 5 mins, and walk 1 min. -Slowly build up weekly to running 30 mins nonstop.  If painful at any of the steps, back up one step.  Exercises on wall.  Heel and butt touching.  Raise leg 6 inches and hold 2 seconds.  Down slow for count of 4 seconds.  1 set of 30 reps daily on both sides.    Stretch after running  PT will call you  See me again in 4-6 weeks

## 2018-08-19 NOTE — Assessment & Plan Note (Signed)
Decision today to treat with OMT was based on Physical Exam  After verbal consent patient was treated with HVLA, ME, FPR techniques in  thoracic, lumbar and sacral areas  Patient tolerated the procedure well with improvement in symptoms  Patient given exercises, stretches and lifestyle modifications  See medications in patient instructions if given  Patient will follow up in 4-8 weeks 

## 2018-08-19 NOTE — Assessment & Plan Note (Signed)
Patient did initially respond well to the epidural but is still having some discomfort and pain but more of a soreness.  We discussed with patient in great length about different treatment options and patient is elected with formal physical therapy and osteopathic manipulation.  Discussed importance of weight loss and core strengthening.  No change in medicine.  Follow-up again in 4 to 6 weeks

## 2018-09-21 ENCOUNTER — Ambulatory Visit: Payer: BLUE CROSS/BLUE SHIELD | Admitting: Family Medicine

## 2019-02-02 ENCOUNTER — Ambulatory Visit: Payer: BC Managed Care – PPO | Attending: Internal Medicine

## 2019-02-02 DIAGNOSIS — Z20822 Contact with and (suspected) exposure to covid-19: Secondary | ICD-10-CM | POA: Diagnosis not present

## 2019-02-03 LAB — NOVEL CORONAVIRUS, NAA: SARS-CoV-2, NAA: NOT DETECTED

## 2019-03-16 ENCOUNTER — Encounter: Payer: Self-pay | Admitting: Family Medicine

## 2019-05-11 ENCOUNTER — Encounter: Payer: Self-pay | Admitting: Physician Assistant

## 2019-05-11 ENCOUNTER — Other Ambulatory Visit: Payer: Self-pay

## 2019-05-11 ENCOUNTER — Ambulatory Visit (INDEPENDENT_AMBULATORY_CARE_PROVIDER_SITE_OTHER): Payer: Self-pay | Admitting: Physician Assistant

## 2019-05-11 ENCOUNTER — Ambulatory Visit
Admission: RE | Admit: 2019-05-11 | Discharge: 2019-05-11 | Disposition: A | Discharge status: Home / Self Care | Payer: BC Managed Care – PPO | Source: Ambulatory Visit | Attending: Physician Assistant | Admitting: Physician Assistant

## 2019-05-11 ENCOUNTER — Ambulatory Visit: Payer: Self-pay

## 2019-05-11 VITALS — BP 162/113 | HR 83 | Temp 97.2°F | Resp 16 | Ht 63.0 in | Wt 207.0 lb

## 2019-05-11 DIAGNOSIS — R0689 Other abnormalities of breathing: Secondary | ICD-10-CM | POA: Diagnosis not present

## 2019-05-11 DIAGNOSIS — I10 Essential (primary) hypertension: Secondary | ICD-10-CM | POA: Insufficient documentation

## 2019-05-11 DIAGNOSIS — R5383 Other fatigue: Secondary | ICD-10-CM

## 2019-05-11 DIAGNOSIS — R000 Tachycardia, unspecified: Secondary | ICD-10-CM

## 2019-05-11 DIAGNOSIS — R0602 Shortness of breath: Secondary | ICD-10-CM

## 2019-05-11 DIAGNOSIS — R0789 Other chest pain: Secondary | ICD-10-CM

## 2019-05-11 DIAGNOSIS — R030 Elevated blood-pressure reading, without diagnosis of hypertension: Secondary | ICD-10-CM

## 2019-05-11 HISTORY — DX: Essential (primary) hypertension: I10

## 2019-05-11 LAB — POCT I-STAT CREATININE: Creatinine, Ser: 0.8 mg/dL (ref 0.44–1.00)

## 2019-05-11 MED ORDER — IOHEXOL 350 MG/ML SOLN
75.0000 mL | Freq: Once | INTRAVENOUS | Status: AC | PRN
  Administered 2019-05-11: 75 mL via INTRAVENOUS

## 2019-05-11 MED ORDER — AMLODIPINE BESYLATE 10 MG PO TABS: 10.0000 mg | ORAL_TABLET | Freq: Every day | ORAL | 3 refills | Status: DC

## 2019-05-11 NOTE — Progress Notes (Signed)
Established patient visit   Patient: Allison Valdez   DOB: [birth year]   34 y.o. Female  MRN: [id number] Visit Date: 05/11/2019  Today's healthcare provider: Margaretann Loveless, PA-C   Chief Complaint  Patient presents with  . Hypertension   Subjective    HPI Hypertension: Patient here for concerns on elevated blood pressure.Reports that she started to have severe headaches since Friday, reports that she usually have the headaches but not like this.  She is exercising and is not adherent to low salt diet.  Blood pressure reading at home have been 180/115 last night and she took it at 2 am this morning because she woke up with SOB, pressure in her chest and blood pressure was 148/104 with P:111. She rechecked before going to work and it was 145/109. Cardiac symptoms chest pressure/discomfort and fluid in her fingers and her arms feel sore.. Patient denies chest pain, claudication, irregular heart beat, lower extremity edema, near-syncope, palpitations and syncope.  Cardiovascular risk factors: hypertension and obesity (BMI >= 30 kg/m2).   She does take Tylenol PM, Benadryl to help her sleep and IBU for the headache. She does have a history of high blood pressure. Reports that it has been years since she has taken blood pressure medication. Had previously been on Amlodipine 5mg  and HCTZ 25mg .   There is no family history of DVT or clots. There is family history of early CAD in her maternal grandfather. Passed in late 74s to early 50s, right before she was born so she is not sure of his exact age.   She does have IUD in place. No recent travel or surgery. Does not smoke. No known covid-19 exposures. Has not had covid-19 vaccines.   BP Readings from Last 3 Encounters:  05/11/19 (!) 162/113  08/19/18 140/90  04/14/18 134/86   Wt Readings from Last 3 Encounters:  05/11/19 207 lb (93.9 kg)  08/19/18 201 lb (91.2 kg)  04/14/18 204 lb (92.5 kg)    Patient Active Problem List   Diagnosis Date Noted  . Nonallopathic lesion of sacral region 08/19/2018  . Nonallopathic lesion of lumbosacral region 08/19/2018  . Nonallopathic lesion of thoracic region 08/19/2018  . Greater trochanteric bursitis of both hips 09/21/2017  . Pars defect of lumbar spine 09/21/2017  . Low back pain 08/09/2017  . BMI 40.0-44.9, adult (HCC) 11/10/2016  . Adjustment disorder with mixed anxiety and depressed mood 03/16/2016  . Anxiety 03/16/2016  . Abnormal weight gain 03/16/2016  . Chronic infection of sinus 03/07/2007  . Psoriasis 10/05/2005   Past Medical History:  Diagnosis Date  . Anxiety   . Depression   . Obesity   . Vitamin D deficiency disease    Past Surgical History:  Procedure Laterality Date  . OVARIAN CYST REMOVAL Right    Social History   Tobacco Use  . Smoking status: Never Smoker  . Smokeless tobacco: Never Used  Substance Use Topics  . Alcohol use: Yes    Comment: occas  . Drug use: No   No Known Allergies     Medications: Outpatient Medications Prior to Visit  Medication Sig  . levonorgestrel (MIRENA) 20 MCG/24HR IUD 1 each by Intrauterine route once.  03/09/2007 amLODipine (NORVASC) 5 MG tablet Take 1 tablet (5 mg total) by mouth daily. (Patient not taking: Reported on 05/11/2019)  . cyanocobalamin (,VITAMIN B-12,) 1000 MCG/ML injection INJECT Marland Kitchen INTO THE MUSCLE ONCE FOR 1 DOSE.  . hydrochlorothiazide (HYDRODIURIL) 25 MG tablet Take  1 tablet (25 mg total) by mouth daily. (Patient not taking: Reported on 05/11/2019)  . phentermine (ADIPEX-P) 37.5 MG tablet Take 1 tablet (37.5 mg total) by mouth daily before breakfast.   No facility-administered medications prior to visit.    Review of Systems  Constitutional: Positive for fatigue. Negative for fever.  HENT: Positive for sinus pressure. Negative for congestion, ear pain, postnasal drip, rhinorrhea, sinus pain, sore throat and trouble swallowing.   Eyes: Positive for visual disturbance.  Respiratory: Positive  for chest tightness and shortness of breath ("started yesterday evening very tight").   Cardiovascular: Positive for palpitations. Negative for leg swelling. Chest pain: "chest Pressure"  Gastrointestinal: Negative for abdominal pain.  Endocrine: Positive for cold intolerance and heat intolerance.  Neurological: Positive for headaches. Negative for dizziness.    Last CBC Lab Results  Component Value Date   WBC 1.9 (LL) 01/29/2012   HGB 14.4 01/29/2012   HCT 42.1 01/29/2012   MCV 89 01/29/2012   MCH 30.3 01/29/2012   RDW 12.8 01/29/2012   PLT 137 (L) 01/29/2012   Last metabolic panel Lab Results  Component Value Date   GLUCOSE 85 01/29/2012   NA 140 01/29/2012   K 3.9 01/29/2012   CL 106 01/29/2012   CO2 30 01/29/2012   BUN 6 (L) 01/29/2012   CREATININE 0.73 01/29/2012   GFRNONAA >60 01/29/2012   GFRAA >60 01/29/2012   CALCIUM 9.0 01/29/2012   PROT 8.0 01/29/2012   ALBUMIN 4.3 01/29/2012   BILITOT 0.4 01/29/2012   ALKPHOS 59 01/29/2012   AST 20 01/29/2012   ALT 22 01/29/2012   ANIONGAP 4 (L) 01/29/2012   Last lipids No results found for: CHOL, HDL, LDLCALC, LDLDIRECT, TRIG, CHOLHDL Last hemoglobin A1c No results found for: HGBA1C    Objective    BP (!) 162/113 (BP Location: Left Arm, Patient Position: Sitting, Cuff Size: Large)   Pulse 83   Temp (!) 97.2 F (36.2 C) (Temporal)   Resp 16   Ht 5\' 3"  (1.6 m)   Wt 207 lb (93.9 kg)   BMI 36.67 kg/m  BP Readings from Last 3 Encounters:  05/11/19 (!) 162/113  08/19/18 140/90  04/14/18 134/86   Wt Readings from Last 3 Encounters:  05/11/19 207 lb (93.9 kg)  08/19/18 201 lb (91.2 kg)  04/14/18 204 lb (92.5 kg)      Physical Exam Vitals reviewed.  Constitutional:      General: She is not in acute distress.    Appearance: Normal appearance. She is well-developed. She is obese. She is ill-appearing. She is not diaphoretic.  HENT:     Head: Normocephalic and atraumatic.  Eyes:     General: No scleral  icterus.    Extraocular Movements: Extraocular movements intact.     Pupils: Pupils are equal, round, and reactive to light.  Neck:     Thyroid: No thyromegaly.     Vascular: No carotid bruit or JVD.     Trachea: No tracheal deviation.  Cardiovascular:     Rate and Rhythm: Normal rate and regular rhythm.     Pulses: Normal pulses.     Heart sounds: Normal heart sounds. No murmur. No friction rub. No gallop.   Pulmonary:     Effort: Pulmonary effort is normal. No respiratory distress.     Breath sounds: Examination of the right-lower field reveals decreased breath sounds. Decreased breath sounds present. No wheezing, rhonchi or rales.    Musculoskeletal:     Cervical back: Normal  range of motion and neck supple.     Comments: Patient has swelling in hands bilaterally  Lymphadenopathy:     Cervical: No cervical adenopathy.  Skin:    Capillary Refill: Capillary refill takes less than 2 seconds.  Neurological:     General: No focal deficit present.     Mental Status: She is alert. Mental status is at baseline.  Psychiatric:        Mood and Affect: Mood normal.        Thought Content: Thought content normal.       No results found for any visits on 05/11/19.  Assessment & Plan     1. Elevated blood pressure reading EKG today shows sinus rhythm rate of 85 with left axis deviation and Abnormal precordial QRS contours (nondiagnostic for age).  - EKG 12-Lead  2. Essential hypertension Will start amlodipine as below for HTN and f/u on Monday, 05/15/19. Have ordered a stat CT angio chest as below since patient is having chest pressure, SOB, some intermittent tachycardia, and decreased lung sounds in the RLLL. Lower risk for PE but symptoms are worrisome so want to r/o a pulmonary embolism. Also possible to have pneumonia. I will f/u pending CT results for treatment. Call or go to ER if symptoms worsen.  - amLODipine (NORVASC) 10 MG tablet; Take 1 tablet (10 mg total) by mouth daily.   Dispense: 90 tablet; Refill: 3 - CT Angio Chest W/Cm &/Or Wo Cm; Future  3. Other chest pain See above medical treatment plan. - CT Angio Chest W/Cm &/Or Wo Cm; Future  4. Shortness of breath See above medical treatment plan. - CT Angio Chest W/Cm &/Or Wo Cm; Future  5. Decreased breath sounds at right lung base See above medical treatment plan. - CT Angio Chest W/Cm &/Or Wo Cm; Future  6. Tachycardia Last night, woke her from sleep with chest pressure and SOB. See above medical treatment plan. - CT Angio Chest W/Cm &/Or Wo Cm; Future  7. Fatigue, unspecified type See above medical treatment plan. - CT Angio Chest W/Cm &/Or Wo Cm; Future  No follow-ups on file.      Reynolds Bowl, PA-C, have reviewed all documentation for this visit. The documentation on 05/11/19 for the exam, diagnosis, procedures, and orders are all accurate and complete.   Rubye Beach  [facility] [phone] (phone) [phone] (fax)  [place]

## 2019-05-11 NOTE — Telephone Encounter (Signed)
Pt. Reports she has had a headache x 2 days. Checked BP last - 180/100. This morning at 0645 140/110. Had an episode last night of shortness of breath."I'm not sure if it was anxiety or not." Denies any shortness of breath or chest pain presently.Pt. at work. Has not been on Norvasc x 8 years. Appointment made for today. Instructed if symptoms worsen go to ED,verbalizes understanding.  Reason for Disposition . Systolic BP  >= 180 OR Diastolic >= 110  Answer Assessment - Initial Assessment Questions 1. BLOOD PRESSURE: "What is the blood pressure?" "Did you take at least two measurements 5 minutes apart?"     140/110 2. ONSET: "When did you take your blood pressure?"     0645 3. HOW: "How did you obtain the blood pressure?" (e.g., visiting nurse, automatic home BP monitor)     Home monitor 4. HISTORY: "Do you have a history of high blood pressure?"     Yes 5. MEDICATIONS: "Are you taking any medications for blood pressure?" "Have you missed any doses recently?"     No - stopped it 8 years 6. OTHER SYMPTOMS: "Do you have any symptoms?" (e.g., headache, chest pain, blurred vision, difficulty breathing, weakness)     Headache 7. PREGNANCY: "Is there any chance you are pregnant?" "When was your last menstrual period?"     No  Protocols used: HIGH BLOOD PRESSURE-A-AH

## 2019-05-11 NOTE — Patient Instructions (Signed)

## 2019-05-12 ENCOUNTER — Encounter: Payer: Self-pay | Admitting: Physician Assistant

## 2019-05-12 NOTE — Progress Notes (Signed)
Established patient visit   Patient: Allison Valdez   DOB: [birth year]   34 y.o. Female  MRN: [id number]   Today's healthcare provider: Margaretann Loveless, PA-C   Chief Complaint  Patient presents with  . Follow-up    BP   Subjective    HPI Follow up for Hypertenstion  The patient was last seen for this 5 days ago. Changes made at last visit include Start Amlodipine 10mg .  BP Readings from Last 3 Encounters:  05/15/19 133/90  05/11/19 (!) 162/113  08/19/18 140/90    She reports excellent compliance with treatment. She feels that condition is Improved. She is not having side effects.  Reports that she still having the headaches now for 2 weeks.  -----------------------------------------------------------------------------------------      Medications: Outpatient Medications Prior to Visit  Medication Sig  . amLODipine (NORVASC) 10 MG tablet Take 1 tablet (10 mg total) by mouth daily.  10/19/18 levonorgestrel (MIRENA) 20 MCG/24HR IUD 1 each by Intrauterine route once.  . hydrochlorothiazide (HYDRODIURIL) 25 MG tablet Take 1 tablet (25 mg total) by mouth daily. (Patient not taking: Reported on 05/11/2019)   No facility-administered medications prior to visit.    Review of Systems  Constitutional: Positive for fatigue.  Respiratory: Positive for chest tightness.   Cardiovascular: Negative for chest pain, palpitations and leg swelling.  Neurological: Positive for headaches.    Last CBC Lab Results  Component Value Date   WBC 1.9 (LL) 01/29/2012   HGB 14.4 01/29/2012   HCT 42.1 01/29/2012   MCV 89 01/29/2012   MCH 30.3 01/29/2012   RDW 12.8 01/29/2012   PLT 137 (L) 01/29/2012   Last metabolic panel Lab Results  Component Value Date   GLUCOSE 85 01/29/2012   NA 140 01/29/2012   K 3.9 01/29/2012   CL 106 01/29/2012   CO2 30 01/29/2012   BUN 6 (L) 01/29/2012   CREATININE 0.80 05/11/2019   GFRNONAA >60 01/29/2012   GFRAA >60 01/29/2012   CALCIUM  9.0 01/29/2012   PROT 8.0 01/29/2012   ALBUMIN 4.3 01/29/2012   BILITOT 0.4 01/29/2012   ALKPHOS 59 01/29/2012   AST 20 01/29/2012   ALT 22 01/29/2012   ANIONGAP 4 (L) 01/29/2012      Objective    BP 133/90 (BP Location: Left Arm, Patient Position: Sitting, Cuff Size: Large)   Temp (!) 96.9 F (36.1 C) (Temporal)   Resp 16   Wt 204 lb (92.5 kg)   BMI 36.14 kg/m  BP Readings from Last 3 Encounters:  05/15/19 133/90  05/11/19 (!) 162/113  08/19/18 140/90   Wt Readings from Last 3 Encounters:  05/15/19 204 lb (92.5 kg)  05/11/19 207 lb (93.9 kg)  08/19/18 201 lb (91.2 kg)      Physical Exam Vitals reviewed.  Constitutional:      General: She is not in acute distress.    Appearance: Normal appearance. She is well-developed. She is obese. She is not ill-appearing or diaphoretic.  HENT:     Head: Normocephalic and atraumatic.     Right Ear: Hearing, tympanic membrane, ear canal and external ear normal.     Left Ear: Hearing, tympanic membrane, ear canal and external ear normal.     Nose: Nose normal.     Mouth/Throat:     Mouth: Mucous membranes are moist.     Pharynx: Uvula midline. No oropharyngeal exudate or posterior oropharyngeal erythema.  Eyes:     General: No scleral icterus.  Right eye: No discharge.        Left eye: No discharge.     Extraocular Movements: Extraocular movements intact.     Conjunctiva/sclera: Conjunctivae normal.     Pupils: Pupils are equal, round, and reactive to light.  Neck:     Thyroid: No thyromegaly.     Trachea: No tracheal deviation.  Cardiovascular:     Rate and Rhythm: Normal rate and regular rhythm.     Pulses: Normal pulses.     Heart sounds: Normal heart sounds. No murmur. No friction rub. No gallop.   Pulmonary:     Effort: Pulmonary effort is normal. No respiratory distress.     Breath sounds: Normal breath sounds. No stridor. No wheezing or rales.  Musculoskeletal:     Cervical back: Normal range of motion and  neck supple.  Lymphadenopathy:     Cervical: No cervical adenopathy.  Skin:    General: Skin is warm and dry.  Neurological:     Mental Status: She is alert.       No results found for any visits on 05/15/19.  Assessment & Plan     1. Essential hypertension Continue Amlodipine 10mg . Return in 4 weeks.   2. Intractable chronic migraine without aura and with status migrainosus For over 2 weeks. Possible sinus inflammation. Will give prednisone as below. - predniSONE (DELTASONE) 20 MG tablet; Take 1 tablet (20 mg total) by mouth daily with breakfast.  Dispense: 10 tablet; Refill: 0  3. Class 2 severe obesity due to excess calories with serious comorbidity and body mass index (BMI) of 36.0 to 36.9 in adult [facility]) Counseled patient on healthy lifestyle modifications including dieting and exercise.    No follow-ups on file.      Reynolds Bowl, PA-C, have reviewed all documentation for this visit. The documentation on 05/15/19 for the exam, diagnosis, procedures, and orders are all accurate and complete.   Rubye Beach  [facility] [phone] (phone) [phone] (fax)  [place]

## 2019-05-15 ENCOUNTER — Ambulatory Visit (INDEPENDENT_AMBULATORY_CARE_PROVIDER_SITE_OTHER): Payer: BC Managed Care – PPO | Admitting: Physician Assistant

## 2019-05-15 ENCOUNTER — Encounter: Payer: Self-pay | Admitting: Physician Assistant

## 2019-05-15 ENCOUNTER — Other Ambulatory Visit: Payer: Self-pay

## 2019-05-15 VITALS — BP 133/90 | Temp 96.9°F | Resp 16 | Wt 204.0 lb

## 2019-05-15 DIAGNOSIS — Z6836 Body mass index (BMI) 36.0-36.9, adult: Secondary | ICD-10-CM

## 2019-05-15 DIAGNOSIS — I10 Essential (primary) hypertension: Secondary | ICD-10-CM | POA: Diagnosis not present

## 2019-05-15 DIAGNOSIS — G43711 Chronic migraine without aura, intractable, with status migrainosus: Secondary | ICD-10-CM | POA: Diagnosis not present

## 2019-05-15 MED ORDER — PREDNISONE 20 MG PO TABS: 20.0000 mg | ORAL_TABLET | Freq: Every day | ORAL | 0 refills | Status: DC

## 2019-05-15 NOTE — Patient Instructions (Signed)

## 2019-06-16 NOTE — Progress Notes (Signed)
Established patient visit   Patient: Allison Valdez   DOB: 02-07-1985   34 y.o. Female  MRN: [id number] Visit Date: 06/19/2019  Today's healthcare provider: Mar Daring, PA-C   Chief Complaint  Patient presents with  . Follow-up    HTN   Subjective    HPI Hypertension, follow-up  BP Readings from Last 3 Encounters:  06/19/19 131/87  05/15/19 133/90  05/11/19 (!) 162/113   Wt Readings from Last 3 Encounters:  06/19/19 207 lb 12.8 oz (94.3 kg)  05/15/19 204 lb (92.5 kg)  05/11/19 207 lb (93.9 kg)     She was last seen for hypertension 4 weeks ago.  BP at that visit was 133/90. Management since that visit includes Continue Amlodipine 10mg .  She reports excellent compliance with treatment. She is not having side effects.  She is following a Regular diet. She is exercising. Trying. She does not smoke.  Outside blood pressures are not being checked. Symptoms: No chest pain No chest pressure  No palpitations No syncope  No dyspnea No orthopnea  No paroxysmal nocturnal dyspnea No lower extremity edema   Pertinent labs: No results found for: CHOL, HDL, LDLCALC, LDLDIRECT, TRIG, CHOLHDL Lab Results  Component Value Date   NA 140 01/29/2012   K 3.9 01/29/2012   CREATININE 0.80 05/11/2019   GFRNONAA >60 01/29/2012   GFRAA >60 01/29/2012   GLUCOSE 85 01/29/2012     The ASCVD Risk score (Goff DC Jr., et al., 2013) failed to calculate for the following reasons:   The 2013 ASCVD risk score is only valid for ages 80 to 70   ---------------------------------------------------------------------------------------------------     Medications: Outpatient Medications Prior to Visit  Medication Sig  . amLODipine (NORVASC) 10 MG tablet Take 1 tablet (10 mg total) by mouth daily.  Marland Kitchen levonorgestrel (MIRENA) 20 MCG/24HR IUD 1 each by Intrauterine route once.  . hydrochlorothiazide (HYDRODIURIL) 25 MG tablet Take 1 tablet (25 mg total) by mouth daily.  (Patient not taking: Reported on 05/11/2019)  . predniSONE (DELTASONE) 20 MG tablet Take 1 tablet (20 mg total) by mouth daily with breakfast.   No facility-administered medications prior to visit.    Review of Systems  Constitutional: Negative.   Eyes: Negative for visual disturbance.  Respiratory: Negative for cough and shortness of breath.   Cardiovascular: Negative for chest pain, palpitations and leg swelling.  Neurological: Negative for dizziness, light-headedness and headaches.    Last CBC Lab Results  Component Value Date   WBC 1.9 (LL) 01/29/2012   HGB 14.4 01/29/2012   HCT 42.1 01/29/2012   MCV 89 01/29/2012   MCH 30.3 01/29/2012   RDW 12.8 01/29/2012   PLT 137 (L) 64/40/3474   Last metabolic panel Lab Results  Component Value Date   GLUCOSE 85 01/29/2012   NA 140 01/29/2012   K 3.9 01/29/2012   CL 106 01/29/2012   CO2 30 01/29/2012   BUN 6 (L) 01/29/2012   CREATININE 0.80 05/11/2019   GFRNONAA >60 01/29/2012   GFRAA >60 01/29/2012   CALCIUM 9.0 01/29/2012   PROT 8.0 01/29/2012   ALBUMIN 4.3 01/29/2012   BILITOT 0.4 01/29/2012   ALKPHOS 59 01/29/2012   AST 20 01/29/2012   ALT 22 01/29/2012   ANIONGAP 4 (L) 01/29/2012      Objective    BP 131/87 (BP Location: Left Arm, Patient Position: Sitting, Cuff Size: Normal)   Pulse 76   Temp (!) 97 F (36.1 C) (Temporal)  Resp 16   Wt 207 lb 12.8 oz (94.3 kg)   BMI 36.81 kg/m     Physical Exam Vitals reviewed.  Constitutional:      General: She is not in acute distress.    Appearance: Normal appearance. She is well-developed. She is obese. She is not ill-appearing or diaphoretic.  Cardiovascular:     Rate and Rhythm: Normal rate and regular rhythm.     Pulses: Normal pulses.     Heart sounds: Normal heart sounds. No murmur. No friction rub. No gallop.   Pulmonary:     Effort: Pulmonary effort is normal. No respiratory distress.     Breath sounds: Normal breath sounds. No wheezing or rales.    Musculoskeletal:     Cervical back: Normal range of motion and neck supple.     Right lower leg: No edema.     Left lower leg: No edema.  Neurological:     Mental Status: She is alert.      No results found for any visits on 06/19/19.  Assessment & Plan     1. Essential hypertension Stable on Amlodipine 10mg . Return in 6 months for CPE.   No follow-ups on file.      , PA-C, have reviewed all documentation for this visit. The documentation on 06/19/19 for the exam, diagnosis, procedures, and orders are all accurate and complete.   08/19/19  [facility] [phone] (phone) [phone] (fax)  [facility]

## 2019-06-19 ENCOUNTER — Encounter: Payer: Self-pay | Admitting: Physician Assistant

## 2019-06-19 ENCOUNTER — Ambulatory Visit (INDEPENDENT_AMBULATORY_CARE_PROVIDER_SITE_OTHER): Payer: BC Managed Care – PPO | Admitting: Physician Assistant

## 2019-06-19 ENCOUNTER — Other Ambulatory Visit: Payer: Self-pay

## 2019-06-19 DIAGNOSIS — I10 Essential (primary) hypertension: Secondary | ICD-10-CM | POA: Diagnosis not present

## 2019-06-19 NOTE — Patient Instructions (Signed)
DASH Eating Plan DASH stands for "Dietary Approaches to Stop Hypertension." The DASH eating plan is a healthy eating plan that has been shown to reduce high blood pressure (hypertension). It may also reduce your risk for type 2 diabetes, heart disease, and stroke. The DASH eating plan may also help with weight loss. What are tips for following this plan?  General guidelines  Avoid eating more than 2,300 mg (milligrams) of salt (sodium) a day. If you have hypertension, you may need to reduce your sodium intake to 1,500 mg a day.  Limit alcohol intake to no more than 1 drink a day for nonpregnant women and 2 drinks a day for men. One drink equals 12 oz of beer, 5 oz of wine, or 1 oz of hard liquor.  Work with your health care provider to maintain a healthy body weight or to lose weight. Ask what an ideal weight is for you.  Get at least 30 minutes of exercise that causes your heart to beat faster (aerobic exercise) most days of the week. Activities may include walking, swimming, or biking.  Work with your health care provider or diet and nutrition specialist (dietitian) to adjust your eating plan to your individual calorie needs. Reading food labels   Check food labels for the amount of sodium per serving. Choose foods with less than 5 percent of the Daily Value of sodium. Generally, foods with less than 300 mg of sodium per serving fit into this eating plan.  To find whole grains, look for the word "whole" as the first word in the ingredient list. Shopping  Buy products labeled as "low-sodium" or "no salt added."  Buy fresh foods. Avoid canned foods and premade or frozen meals. Cooking  Avoid adding salt when cooking. Use salt-free seasonings or herbs instead of table salt or sea salt. Check with your health care provider or pharmacist before using salt substitutes.  Do not fry foods. Cook foods using healthy methods such as baking, boiling, grilling, and broiling instead.  Cook with  heart-healthy oils, such as olive, canola, soybean, or sunflower oil. Meal planning  Eat a balanced diet that includes: ? 5 or more servings of fruits and vegetables each day. At each meal, try to fill half of your plate with fruits and vegetables. ? Up to 6-8 servings of whole grains each day. ? Less than 6 oz of lean meat, poultry, or fish each day. A 3-oz serving of meat is about the same size as a deck of cards. One egg equals 1 oz. ? 2 servings of low-fat dairy each day. ? A serving of nuts, seeds, or beans 5 times each week. ? Heart-healthy fats. Healthy fats called Omega-3 fatty acids are found in foods such as flaxseeds and coldwater fish, like sardines, salmon, and mackerel.  Limit how much you eat of the following: ? Canned or prepackaged foods. ? Food that is high in trans fat, such as fried foods. ? Food that is high in saturated fat, such as fatty meat. ? Sweets, desserts, sugary drinks, and other foods with added sugar. ? Full-fat dairy products.  Do not salt foods before eating.  Try to eat at least 2 vegetarian meals each week.  Eat more home-cooked food and less restaurant, buffet, and fast food.  When eating at a restaurant, ask that your food be prepared with less salt or no salt, if possible. What foods are recommended? The items listed may not be a complete list. Talk with your dietitian about   what dietary choices are best for you. Grains Whole-grain or whole-wheat bread. Whole-grain or whole-wheat pasta. Brown rice. Oatmeal. Quinoa. Bulgur. Whole-grain and low-sodium cereals. Pita bread. Low-fat, low-sodium crackers. Whole-wheat flour tortillas. Vegetables Fresh or frozen vegetables (raw, steamed, roasted, or grilled). Low-sodium or reduced-sodium tomato and vegetable juice. Low-sodium or reduced-sodium tomato sauce and tomato paste. Low-sodium or reduced-sodium canned vegetables. Fruits All fresh, dried, or frozen fruit. Canned fruit in natural juice (without  added sugar). Meat and other protein foods Skinless chicken or turkey. Ground chicken or turkey. Pork with fat trimmed off. Fish and seafood. Egg whites. Dried beans, peas, or lentils. Unsalted nuts, nut butters, and seeds. Unsalted canned beans. Lean cuts of beef with fat trimmed off. Low-sodium, lean deli meat. Dairy Low-fat (1%) or fat-free (skim) milk. Fat-free, low-fat, or reduced-fat cheeses. Nonfat, low-sodium ricotta or cottage cheese. Low-fat or nonfat yogurt. Low-fat, low-sodium cheese. Fats and oils Soft margarine without trans fats. Vegetable oil. Low-fat, reduced-fat, or light mayonnaise and salad dressings (reduced-sodium). Canola, safflower, olive, soybean, and sunflower oils. Avocado. Seasoning and other foods Herbs. Spices. Seasoning mixes without salt. Unsalted popcorn and pretzels. Fat-free sweets. What foods are not recommended? The items listed may not be a complete list. Talk with your dietitian about what dietary choices are best for you. Grains Baked goods made with fat, such as croissants, muffins, or some breads. Dry pasta or rice meal packs. Vegetables Creamed or fried vegetables. Vegetables in a cheese sauce. Regular canned vegetables (not low-sodium or reduced-sodium). Regular canned tomato sauce and paste (not low-sodium or reduced-sodium). Regular tomato and vegetable juice (not low-sodium or reduced-sodium). Pickles. Olives. Fruits Canned fruit in a light or heavy syrup. Fried fruit. Fruit in cream or butter sauce. Meat and other protein foods Fatty cuts of meat. Ribs. Fried meat. Bacon. Sausage. Bologna and other processed lunch meats. Salami. Fatback. Hotdogs. Bratwurst. Salted nuts and seeds. Canned beans with added salt. Canned or smoked fish. Whole eggs or egg yolks. Chicken or turkey with skin. Dairy Whole or 2% milk, cream, and half-and-half. Whole or full-fat cream cheese. Whole-fat or sweetened yogurt. Full-fat cheese. Nondairy creamers. Whipped toppings.  Processed cheese and cheese spreads. Fats and oils Butter. Stick margarine. Lard. Shortening. Ghee. Bacon fat. Tropical oils, such as coconut, palm kernel, or palm oil. Seasoning and other foods Salted popcorn and pretzels. Onion salt, garlic salt, seasoned salt, table salt, and sea salt. Worcestershire sauce. Tartar sauce. Barbecue sauce. Teriyaki sauce. Soy sauce, including reduced-sodium. Steak sauce. Canned and packaged gravies. Fish sauce. Oyster sauce. Cocktail sauce. Horseradish that you find on the shelf. Ketchup. Mustard. Meat flavorings and tenderizers. Bouillon cubes. Hot sauce and Tabasco sauce. Premade or packaged marinades. Premade or packaged taco seasonings. Relishes. Regular salad dressings. Where to find more information:  National Heart, Lung, and Blood Institute: www.nhlbi.nih.gov  American Heart Association: www.heart.org Summary  The DASH eating plan is a healthy eating plan that has been shown to reduce high blood pressure (hypertension). It may also reduce your risk for type 2 diabetes, heart disease, and stroke.  With the DASH eating plan, you should limit salt (sodium) intake to 2,300 mg a day. If you have hypertension, you may need to reduce your sodium intake to 1,500 mg a day.  When on the DASH eating plan, aim to eat more fresh fruits and vegetables, whole grains, lean proteins, low-fat dairy, and heart-healthy fats.  Work with your health care provider or diet and nutrition specialist (dietitian) to adjust your eating plan to your   individual calorie needs. This information is not intended to replace advice given to you by your health care provider. Make sure you discuss any questions you have with your health care provider. Document Revised: 12/11/2016 Document Reviewed: 12/23/2015 Elsevier Patient Education  2020 Elsevier Inc.  

## 2019-08-21 ENCOUNTER — Encounter: Payer: Self-pay | Admitting: Physician Assistant

## 2019-08-22 ENCOUNTER — Ambulatory Visit (INDEPENDENT_AMBULATORY_CARE_PROVIDER_SITE_OTHER): Payer: BC Managed Care – PPO | Admitting: Physician Assistant

## 2019-08-22 ENCOUNTER — Ambulatory Visit: Payer: Self-pay | Admitting: *Deleted

## 2019-08-22 ENCOUNTER — Other Ambulatory Visit: Payer: Self-pay

## 2019-08-22 ENCOUNTER — Encounter: Payer: Self-pay | Admitting: Physician Assistant

## 2019-08-22 VITALS — BP 161/106 | HR 76 | Temp 98.5°F | Wt 218.0 lb

## 2019-08-22 DIAGNOSIS — I10 Essential (primary) hypertension: Secondary | ICD-10-CM

## 2019-08-22 MED ORDER — LISINOPRIL-HYDROCHLOROTHIAZIDE 10-12.5 MG PO TABS: 1.0000 | ORAL_TABLET | Freq: Every day | ORAL | 0 refills | Status: DC

## 2019-08-22 NOTE — Progress Notes (Signed)
Established patient visit   Patient: Pierina Schuknecht   DOB: 07/01/1985   34 y.o. Female  MRN: [id number] Visit Date: 08/22/2019  Today's healthcare provider: Trey Sailors, PA-C   Chief Complaint  Patient presents with  . Hypertension   Subjective    HPI  Elevated BP  BP Readings from Last 3 Encounters:  08/22/19 (!) 161/106  06/19/19 131/87  05/15/19 133/90   Wt Readings from Last 3 Encounters:  08/22/19 218 lb (98.9 kg)  06/19/19 207 lb 12.8 oz (94.3 kg)  05/15/19 204 lb (92.5 kg)     She reports good compliance with treatment. She is having side effects. She reports that she does have occasional chest tightness. She has been taking amlodipine 10 mg daily and she does not miss any pills.  She is following a Regular diet. She is not exercising. She does not smoke.  Use of agents associated with hypertension: none.   Outside blood pressures are checked daily.  She reports that it has averaged in the 160s/100s.  Symptoms: Yes chest pain Yes chest pressure  Yes palpitations No syncope  No dyspnea No orthopnea  No paroxysmal nocturnal dyspnea No lower extremity edema   Pertinent labs: No results found for: CHOL, HDL, LDLCALC, LDLDIRECT, TRIG, CHOLHDL Lab Results  Component Value Date   NA 140 01/29/2012   K 3.9 01/29/2012   CREATININE 0.80 05/11/2019   GFRNONAA >60 01/29/2012   GFRAA >60 01/29/2012   GLUCOSE 85 01/29/2012     The ASCVD Risk score (Goff DC Jr., et al., 2013) failed to calculate for the following reasons:   The 2013 ASCVD risk score is only valid for ages 69 to 69    Pulse Readings from Last 3 Encounters:  08/22/19 76  06/19/19 76  05/11/19 83    Medications: Outpatient Medications Prior to Visit  Medication Sig  . amLODipine (NORVASC) 10 MG tablet Take 1 tablet (10 mg total) by mouth daily.  Marland Kitchen levonorgestrel (MIRENA) 20 MCG/24HR IUD 1 each by Intrauterine route once.   No facility-administered medications prior to visit.     Review of Systems  Constitutional: Positive for fatigue.  Cardiovascular: Positive for chest pain and palpitations. Negative for leg swelling.  Musculoskeletal: Positive for myalgias.  Neurological: Positive for dizziness and headaches.      Objective    BP (!) 161/106 (BP Location: Right Arm)   Pulse 76   Temp 98.5 F (36.9 C)   Wt 218 lb (98.9 kg)   BMI 38.62 kg/m    Physical Exam Constitutional:      Appearance: Normal appearance.  Cardiovascular:     Rate and Rhythm: Normal rate and regular rhythm.     Heart sounds: Normal heart sounds.  Pulmonary:     Effort: Pulmonary effort is normal.     Breath sounds: Normal breath sounds.  Skin:    General: Skin is warm and dry.  Neurological:     Mental Status: She is alert and oriented to person, place, and time. Mental status is at baseline.  Psychiatric:        Mood and Affect: Mood normal.        Behavior: Behavior normal.       No results found for any visits on 08/22/19.  Assessment & Plan    1. Essential hypertension  Start below in addition to amlodipine. Follow up one month.   - Comprehensive Metabolic Panel (CMET) - lisinopril-hydrochlorothiazide (ZESTORETIC) 10-12.5 MG tablet;  Take 1 tablet by mouth daily.  Dispense: 90 tablet; Refill: 0   No follow-ups on file.      ITrey Sailors, PA-C, have reviewed all documentation for this visit. The documentation on 08/30/19 for the exam, diagnosis, procedures, and orders are all accurate and complete.  The entirety of the information documented in the History of Present Illness, Review of Systems and Physical Exam were personally obtained by me. Portions of this information were initially documented by [facility] and reviewed by me for thoroughness and accuracy.     Maryella Shivers  [facility] [phone] (phone) [phone] (fax)  [facility]

## 2019-08-22 NOTE — Telephone Encounter (Signed)
Did not need to speak with pt.   Been in contact with Joycelyn Man, PA-C with same symptoms and was calling in to make an appt at [facility] instructions.

## 2019-08-23 LAB — COMPREHENSIVE METABOLIC PANEL
ALT: 21 IU/L (ref 0–32)
AST: 21 IU/L (ref 0–40)
Albumin/Globulin Ratio: 2.3 — ABNORMAL HIGH (ref 1.2–2.2)
Albumin: 4.5 g/dL (ref 3.8–4.8)
Alkaline Phosphatase: 51 IU/L (ref 48–121)
BUN/Creatinine Ratio: 15 (ref 9–23)
BUN: 12 mg/dL (ref 6–20)
Bilirubin Total: 0.4 mg/dL (ref 0.0–1.2)
CO2: 21 mmol/L (ref 20–29)
Calcium: 8.9 mg/dL (ref 8.7–10.2)
Chloride: 103 mmol/L (ref 96–106)
Creatinine, Ser: 0.79 mg/dL (ref 0.57–1.00)
GFR calc Af Amer: 113 mL/min/{1.73_m2} (ref >59)
GFR calc non Af Amer: 98 mL/min/{1.73_m2} (ref >59)
Globulin, Total: 2 g/dL (ref 1.5–4.5)
Glucose: 86 mg/dL (ref 65–99)
Potassium: 3.7 mmol/L (ref 3.5–5.2)
Sodium: 138 mmol/L (ref 134–144)
Total Protein: 6.5 g/dL (ref 6.0–8.5)

## 2019-09-25 ENCOUNTER — Encounter: Payer: Self-pay | Admitting: Physician Assistant

## 2019-09-25 ENCOUNTER — Other Ambulatory Visit: Payer: Self-pay

## 2019-09-25 ENCOUNTER — Ambulatory Visit (INDEPENDENT_AMBULATORY_CARE_PROVIDER_SITE_OTHER): Payer: BC Managed Care – PPO | Admitting: Physician Assistant

## 2019-09-25 VITALS — BP 128/91 | HR 75 | Temp 98.6°F | Resp 16 | Wt 221.4 lb

## 2019-09-25 DIAGNOSIS — I10 Essential (primary) hypertension: Secondary | ICD-10-CM | POA: Diagnosis not present

## 2019-09-25 NOTE — Patient Instructions (Signed)
DASH Eating Plan DASH stands for "Dietary Approaches to Stop Hypertension." The DASH eating plan is a healthy eating plan that has been shown to reduce high blood pressure (hypertension). It may also reduce your risk for type 2 diabetes, heart disease, and stroke. The DASH eating plan may also help with weight loss. What are tips for following this plan?  General guidelines  Avoid eating more than 2,300 mg (milligrams) of salt (sodium) a day. If you have hypertension, you may need to reduce your sodium intake to 1,500 mg a day.  Limit alcohol intake to no more than 1 drink a day for nonpregnant women and 2 drinks a day for men. One drink equals 12 oz of beer, 5 oz of wine, or 1 oz of hard liquor.  Work with your health care provider to maintain a healthy body weight or to lose weight. Ask what an ideal weight is for you.  Get at least 30 minutes of exercise that causes your heart to beat faster (aerobic exercise) most days of the week. Activities may include walking, swimming, or biking.  Work with your health care provider or diet and nutrition specialist (dietitian) to adjust your eating plan to your individual calorie needs. Reading food labels   Check food labels for the amount of sodium per serving. Choose foods with less than 5 percent of the Daily Value of sodium. Generally, foods with less than 300 mg of sodium per serving fit into this eating plan.  To find whole grains, look for the word "whole" as the first word in the ingredient list. Shopping  Buy products labeled as "low-sodium" or "no salt added."  Buy fresh foods. Avoid canned foods and premade or frozen meals. Cooking  Avoid adding salt when cooking. Use salt-free seasonings or herbs instead of table salt or sea salt. Check with your health care provider or pharmacist before using salt substitutes.  Do not fry foods. Cook foods using healthy methods such as baking, boiling, grilling, and broiling instead.  Cook with  heart-healthy oils, such as olive, canola, soybean, or sunflower oil. Meal planning  Eat a balanced diet that includes: ? 5 or more servings of fruits and vegetables each day. At each meal, try to fill half of your plate with fruits and vegetables. ? Up to 6-8 servings of whole grains each day. ? Less than 6 oz of lean meat, poultry, or fish each day. A 3-oz serving of meat is about the same size as a deck of cards. One egg equals 1 oz. ? 2 servings of low-fat dairy each day. ? A serving of nuts, seeds, or beans 5 times each week. ? Heart-healthy fats. Healthy fats called Omega-3 fatty acids are found in foods such as flaxseeds and coldwater fish, like sardines, salmon, and mackerel.  Limit how much you eat of the following: ? Canned or prepackaged foods. ? Food that is high in trans fat, such as fried foods. ? Food that is high in saturated fat, such as fatty meat. ? Sweets, desserts, sugary drinks, and other foods with added sugar. ? Full-fat dairy products.  Do not salt foods before eating.  Try to eat at least 2 vegetarian meals each week.  Eat more home-cooked food and less restaurant, buffet, and fast food.  When eating at a restaurant, ask that your food be prepared with less salt or no salt, if possible. What foods are recommended? The items listed may not be a complete list. Talk with your dietitian about   what dietary choices are best for you. Grains Whole-grain or whole-wheat bread. Whole-grain or whole-wheat pasta. Brown rice. Oatmeal. Quinoa. Bulgur. Whole-grain and low-sodium cereals. Pita bread. Low-fat, low-sodium crackers. Whole-wheat flour tortillas. Vegetables Fresh or frozen vegetables (raw, steamed, roasted, or grilled). Low-sodium or reduced-sodium tomato and vegetable juice. Low-sodium or reduced-sodium tomato sauce and tomato paste. Low-sodium or reduced-sodium canned vegetables. Fruits All fresh, dried, or frozen fruit. Canned fruit in natural juice (without  added sugar). Meat and other protein foods Skinless chicken or turkey. Ground chicken or turkey. Pork with fat trimmed off. Fish and seafood. Egg whites. Dried beans, peas, or lentils. Unsalted nuts, nut butters, and seeds. Unsalted canned beans. Lean cuts of beef with fat trimmed off. Low-sodium, lean deli meat. Dairy Low-fat (1%) or fat-free (skim) milk. Fat-free, low-fat, or reduced-fat cheeses. Nonfat, low-sodium ricotta or cottage cheese. Low-fat or nonfat yogurt. Low-fat, low-sodium cheese. Fats and oils Soft margarine without trans fats. Vegetable oil. Low-fat, reduced-fat, or light mayonnaise and salad dressings (reduced-sodium). Canola, safflower, olive, soybean, and sunflower oils. Avocado. Seasoning and other foods Herbs. Spices. Seasoning mixes without salt. Unsalted popcorn and pretzels. Fat-free sweets. What foods are not recommended? The items listed may not be a complete list. Talk with your dietitian about what dietary choices are best for you. Grains Baked goods made with fat, such as croissants, muffins, or some breads. Dry pasta or rice meal packs. Vegetables Creamed or fried vegetables. Vegetables in a cheese sauce. Regular canned vegetables (not low-sodium or reduced-sodium). Regular canned tomato sauce and paste (not low-sodium or reduced-sodium). Regular tomato and vegetable juice (not low-sodium or reduced-sodium). Pickles. Olives. Fruits Canned fruit in a light or heavy syrup. Fried fruit. Fruit in cream or butter sauce. Meat and other protein foods Fatty cuts of meat. Ribs. Fried meat. Bacon. Sausage. Bologna and other processed lunch meats. Salami. Fatback. Hotdogs. Bratwurst. Salted nuts and seeds. Canned beans with added salt. Canned or smoked fish. Whole eggs or egg yolks. Chicken or turkey with skin. Dairy Whole or 2% milk, cream, and half-and-half. Whole or full-fat cream cheese. Whole-fat or sweetened yogurt. Full-fat cheese. Nondairy creamers. Whipped toppings.  Processed cheese and cheese spreads. Fats and oils Butter. Stick margarine. Lard. Shortening. Ghee. Bacon fat. Tropical oils, such as coconut, palm kernel, or palm oil. Seasoning and other foods Salted popcorn and pretzels. Onion salt, garlic salt, seasoned salt, table salt, and sea salt. Worcestershire sauce. Tartar sauce. Barbecue sauce. Teriyaki sauce. Soy sauce, including reduced-sodium. Steak sauce. Canned and packaged gravies. Fish sauce. Oyster sauce. Cocktail sauce. Horseradish that you find on the shelf. Ketchup. Mustard. Meat flavorings and tenderizers. Bouillon cubes. Hot sauce and Tabasco sauce. Premade or packaged marinades. Premade or packaged taco seasonings. Relishes. Regular salad dressings. Where to find more information:  National Heart, Lung, and Blood Institute: www.nhlbi.nih.gov  American Heart Association: www.heart.org Summary  The DASH eating plan is a healthy eating plan that has been shown to reduce high blood pressure (hypertension). It may also reduce your risk for type 2 diabetes, heart disease, and stroke.  With the DASH eating plan, you should limit salt (sodium) intake to 2,300 mg a day. If you have hypertension, you may need to reduce your sodium intake to 1,500 mg a day.  When on the DASH eating plan, aim to eat more fresh fruits and vegetables, whole grains, lean proteins, low-fat dairy, and heart-healthy fats.  Work with your health care provider or diet and nutrition specialist (dietitian) to adjust your eating plan to your   individual calorie needs. This information is not intended to replace advice given to you by your health care provider. Make sure you discuss any questions you have with your health care provider. Document Revised: 12/11/2016 Document Reviewed: 12/23/2015 Elsevier Patient Education  2020 Elsevier Inc.  

## 2019-09-25 NOTE — Progress Notes (Signed)
Established patient visit   Patient: Allison Valdez   DOB: Jun 19, 1985   34 y.o. Female  MRN: [id number] Visit Date: 09/25/2019  Today's healthcare provider: Margaretann Loveless, PA-C   Chief Complaint  Patient presents with  . Follow-up   Subjective    HPI  Hypertension, follow-up  BP Readings from Last 3 Encounters:  09/25/19 (!) 128/91  08/22/19 (!) 161/106  06/19/19 131/87   Wt Readings from Last 3 Encounters:  09/25/19 221 lb 6.4 oz (100.4 kg)  08/22/19 218 lb (98.9 kg)  06/19/19 207 lb 12.8 oz (94.3 kg)     She was last seen for hypertension 1 months ago.  BP at that visit was 161/106. Management since that visit includes in addition to Amlodipine start lisinopril-hydrochlorothiazide.  She reports excellent compliance with treatment. She is having side effects. Legs are cramping a lot She is following a well balanced diet. She is not exercising. She does not smoke.  Outside blood pressures are not being checked.  Symptoms: No chest pain No chest pressure  No palpitations No syncope  No dyspnea No orthopnea  No paroxysmal nocturnal dyspnea No lower extremity edema   Pertinent labs: No results found for: CHOL, HDL, LDLCALC, LDLDIRECT, TRIG, CHOLHDL Lab Results  Component Value Date   NA 138 08/22/2019   K 3.7 08/22/2019   CREATININE 0.79 08/22/2019   GFRNONAA 98 08/22/2019   GFRAA 113 08/22/2019   GLUCOSE 86 08/22/2019     The ASCVD Risk score (Goff DC Jr., et al., 2013) failed to calculate for the following reasons:   The 2013 ASCVD risk score is only valid for ages 25 to 41   ---------------------------------------------------------------------------------------------------     Medications: Outpatient Medications Prior to Visit  Medication Sig  . amLODipine (NORVASC) 10 MG tablet Take 1 tablet (10 mg total) by mouth daily.  Marland Kitchen levonorgestrel (MIRENA) 20 MCG/24HR IUD 1 each by Intrauterine route once.  Marland Kitchen lisinopril-hydrochlorothiazide  (ZESTORETIC) 10-12.5 MG tablet Take 1 tablet by mouth daily.   No facility-administered medications prior to visit.    Review of Systems  Constitutional: Negative for appetite change, chills, fatigue and fever.  Eyes: Negative for visual disturbance.  Respiratory: Negative for chest tightness and shortness of breath.   Cardiovascular: Negative for chest pain and palpitations.  Gastrointestinal: Negative for abdominal pain, nausea and vomiting.  Neurological: Negative for dizziness, weakness and headaches.      Objective    BP (!) 128/91 (BP Location: Left Arm, Patient Position: Sitting, Cuff Size: Large)   Pulse 75   Temp 98.6 F (37 C) (Oral)   Resp 16   Wt 221 lb 6.4 oz (100.4 kg)   BMI 39.22 kg/m    Physical Exam Vitals reviewed.  Constitutional:      General: She is not in acute distress.    Appearance: Normal appearance. She is well-developed. She is obese. She is not ill-appearing or diaphoretic.  Neck:     Thyroid: No thyromegaly.     Vascular: No JVD.     Trachea: No tracheal deviation.  Cardiovascular:     Rate and Rhythm: Normal rate and regular rhythm.     Heart sounds: Normal heart sounds. No murmur heard.  No friction rub. No gallop.   Pulmonary:     Effort: Pulmonary effort is normal. No respiratory distress.     Breath sounds: Normal breath sounds. No wheezing or rales.  Musculoskeletal:     Cervical back: Normal range of  motion and neck supple.  Lymphadenopathy:     Cervical: No cervical adenopathy.  Neurological:     Mental Status: She is alert.       No results found for any visits on 09/25/19.  Assessment & Plan     1. Essential hypertension Stable. Continue Amlodipine 10mg  and lisinopril-HCTZ 10-12.5mg . Call if BP >140/90. F/U in 6 months.    No follow-ups on file.      , PA-C, have reviewed all documentation for this visit. The documentation on 10/03/19 for the exam, diagnosis, procedures, and orders are all  accurate and complete.   10/05/19  [facility] [phone] (phone) [phone] (fax)  [facility]

## 2019-10-02 ENCOUNTER — Telehealth (INDEPENDENT_AMBULATORY_CARE_PROVIDER_SITE_OTHER): Payer: BC Managed Care – PPO | Admitting: Physician Assistant

## 2019-10-02 ENCOUNTER — Encounter: Payer: Self-pay | Admitting: Physician Assistant

## 2019-10-02 DIAGNOSIS — T3695XA Adverse effect of unspecified systemic antibiotic, initial encounter: Secondary | ICD-10-CM

## 2019-10-02 DIAGNOSIS — B379 Candidiasis, unspecified: Secondary | ICD-10-CM | POA: Diagnosis not present

## 2019-10-02 DIAGNOSIS — J0140 Acute pansinusitis, unspecified: Secondary | ICD-10-CM

## 2019-10-02 MED ORDER — FLUCONAZOLE 150 MG PO TABS: 150.0000 mg | ORAL_TABLET | Freq: Once | ORAL | 0 refills | Status: AC

## 2019-10-02 MED ORDER — AMOXICILLIN-POT CLAVULANATE 875-125 MG PO TABS: 1.0000 | ORAL_TABLET | Freq: Two times a day (BID) | ORAL | 0 refills | Status: DC

## 2019-10-02 NOTE — Progress Notes (Signed)
MyChart Video Visit    Virtual Visit via Video Note   This visit type was conducted due to national recommendations for restrictions regarding the COVID-19 Pandemic (e.g. social distancing) in an effort to limit this patient's exposure and mitigate transmission in our community. This patient is at least at moderate risk for complications without adequate follow up. This format is felt to be most appropriate for this patient at this time. Physical exam was limited by quality of the video and audio technology used for the visit.   Patient location: Home Provider location: [facility]  I discussed the limitations of evaluation and management by telemedicine and the availability of in person appointments. The patient expressed understanding and agreed to proceed.  Patient: Allison Valdez   DOB: 07-27-1985   34 y.o. Female  MRN: [id number] Visit Date: 10/02/2019  Today's healthcare provider: Margaretann Loveless, PA-C   No chief complaint on file.  Subjective    HPI  Zakira Ressel presents today for possible sinus infection. She reports symptoms started on Tuesday 09/26/19. She was tested for covid 19 and was negative. She did a virtual appt with a MD through her employer and was told to take sudafed, afrin and he sent in an albuterol inhaler. Symptoms have continued to progress and she reports having nasal congestion, head congestion, post nasal drainage and cough. Cough is improving some with the albuterol. Still has no fevers, chills nausea or vomiting. Does have associated headache.  Patient Active Problem List   Diagnosis Date Noted  . Hypertension   . Nonallopathic lesion of sacral region 08/19/2018  . Nonallopathic lesion of lumbosacral region 08/19/2018  . Nonallopathic lesion of thoracic region 08/19/2018  . Greater trochanteric bursitis of both hips 09/21/2017  . Pars defect of lumbar spine 09/21/2017  . Low back pain 08/09/2017  . Adjustment disorder with mixed anxiety and  depressed mood 03/16/2016  . Anxiety 03/16/2016  . Abnormal weight gain 03/16/2016  . Chronic infection of sinus 03/07/2007  . Psoriasis 10/05/2005   Past Medical History:  Diagnosis Date  . Anxiety   . Depression   . Hypertension   . Obesity   . Vitamin D deficiency disease       Medications: Outpatient Medications Prior to Visit  Medication Sig  . amLODipine (NORVASC) 10 MG tablet Take 1 tablet (10 mg total) by mouth daily.  Marland Kitchen levonorgestrel (MIRENA) 20 MCG/24HR IUD 1 each by Intrauterine route once.  Marland Kitchen lisinopril-hydrochlorothiazide (ZESTORETIC) 10-12.5 MG tablet Take 1 tablet by mouth daily.   No facility-administered medications prior to visit.    Review of Systems  Constitutional: Negative.  Negative for fever.  HENT: Positive for congestion, postnasal drip, sinus pressure and sinus pain. Negative for ear pain, rhinorrhea, sneezing, sore throat, tinnitus and trouble swallowing.   Respiratory: Positive for cough. Negative for chest tightness, shortness of breath and wheezing.   Cardiovascular: Negative.   Gastrointestinal: Negative for nausea and vomiting.  Neurological: Positive for headaches. Negative for dizziness.    Last CBC Lab Results  Component Value Date   WBC 1.9 (LL) 01/29/2012   HGB 14.4 01/29/2012   HCT 42.1 01/29/2012   MCV 89 01/29/2012   MCH 30.3 01/29/2012   RDW 12.8 01/29/2012   PLT 137 (L) 01/29/2012   Last metabolic panel Lab Results  Component Value Date   GLUCOSE 86 08/22/2019   NA 138 08/22/2019   K 3.7 08/22/2019   CL 103 08/22/2019   CO2 21 08/22/2019  BUN 12 08/22/2019   CREATININE 0.79 08/22/2019   GFRNONAA 98 08/22/2019   GFRAA 113 08/22/2019   CALCIUM 8.9 08/22/2019   PROT 6.5 08/22/2019   ALBUMIN 4.5 08/22/2019   LABGLOB 2.0 08/22/2019   AGRATIO 2.3 (H) 08/22/2019   BILITOT 0.4 08/22/2019   ALKPHOS 51 08/22/2019   AST 21 08/22/2019   ALT 21 08/22/2019   ANIONGAP 4 (L) 01/29/2012      Objective    There were no  vitals taken for this visit. BP Readings from Last 3 Encounters:  09/25/19 (!) 128/91  08/22/19 (!) 161/106  06/19/19 131/87   Wt Readings from Last 3 Encounters:  09/25/19 221 lb 6.4 oz (100.4 kg)  08/22/19 218 lb (98.9 kg)  06/19/19 207 lb 12.8 oz (94.3 kg)      Physical Exam Vitals reviewed.  Constitutional:      General: She is not in acute distress.    Appearance: Normal appearance. She is well-developed. She is not ill-appearing.  HENT:     Head: Normocephalic and atraumatic.  Pulmonary:     Effort: Pulmonary effort is normal. No respiratory distress.  Musculoskeletal:     Cervical back: Normal range of motion and neck supple.  Neurological:     Mental Status: She is alert.  Psychiatric:        Mood and Affect: Mood normal.        Behavior: Behavior normal.        Thought Content: Thought content normal.        Judgment: Judgment normal.       Assessment & Plan     1. Acute non-recurrent pansinusitis Worsening symptoms that have not responded to OTC medications. Stop Afrin and sudafed. Will give augmentin as below. Can add flonase or nasacort instead. Also saline nasal rinses can help. Stay well hydrated and get plenty of rest. Call if no symptom improvement or if symptoms worsen. - amoxicillin-clavulanate (AUGMENTIN) 875-125 MG tablet; Take 1 tablet by mouth 2 (two) times daily.  Dispense: 20 tablet; Refill: 0  2. Antibiotic-induced yeast infection Gets yeast infections with antibiotics. Diflucan given as below.  - fluconazole (DIFLUCAN) 150 MG tablet; Take 1 tablet (150 mg total) by mouth once for 1 dose. May repeat in 48-72 hrs if needed  Dispense: 2 tablet; Refill: 0   No follow-ups on file.     I discussed the assessment and treatment plan with the patient. The patient was provided an opportunity to ask questions and all were answered. The patient agreed with the plan and demonstrated an understanding of the instructions.   The patient was advised to call  back or seek an in-person evaluation if the symptoms worsen or if the condition fails to improve as anticipated.  I provided 8 minutes of non-face-to-face time during this encounter.  Delmer Islam, PA-C, have reviewed all documentation for this visit. The documentation on 10/02/19 for the exam, diagnosis, procedures, and orders are all accurate and complete.  [facility] [phone] (phone) [phone] (fax)  [facility]

## 2019-11-17 ENCOUNTER — Other Ambulatory Visit: Payer: Self-pay | Admitting: Physician Assistant

## 2019-11-17 DIAGNOSIS — I10 Essential (primary) hypertension: Secondary | ICD-10-CM

## 2019-12-11 ENCOUNTER — Encounter: Payer: Self-pay | Admitting: Physician Assistant

## 2019-12-20 NOTE — Progress Notes (Signed)
Complete physical exam   Patient: Allison Valdez   DOB: 1985/09/05   34 y.o. Female  MRN: [id number] Visit Date: 12/22/2019  Today's healthcare provider: Margaretann Loveless, PA-C   Chief Complaint  Patient presents with   Annual Exam   Subjective    Allison Valdez is a 34 y.o. female who presents today for a complete physical exam.  She reports consuming a counting caloriees, she started Phentermine 37.5 mg from [facility] on 12/21/19 diet. Home exercise routine includes walks. She generally feels well. She reports sleeping well. She does not have additional problems to discuss today.  HPI  12/27/17-Pap abnormal-followed by GYN  Past Medical History:  Diagnosis Date   Anxiety    Depression    Hypertension    Obesity    Vitamin D deficiency disease    Past Surgical History:  Procedure Laterality Date   OVARIAN CYST REMOVAL Right    Social History   Socioeconomic History   Marital status: Married    Spouse name: Not on file   Number of children: Not on file   Years of education: Not on file   Highest education level: Not on file  Occupational History   Not on file  Tobacco Use   Smoking status: Never Smoker   Smokeless tobacco: Never Used  Vaping Use   Vaping Use: Never used  Substance and Sexual Activity   Alcohol use: Yes    Comment: occas   Drug use: No   Sexual activity: Yes    Birth control/protection: I.U.D.    Comment: Mirena  Other Topics Concern   Not on file  Social History Narrative   Not on file   Social Determinants of Health   Financial Resource Strain: Not on file  Food Insecurity: Not on file  Transportation Needs: Not on file  Physical Activity: Not on file  Stress: Not on file  Social Connections: Not on file  Intimate Partner Violence: Not on file   Family Status  Relation Name Status   Mother  Alive   Father  Deceased   Family History  Problem Relation Age of Onset   Diabetes  Mother    Diabetes Father    No Known Allergies  Patient Care Team: Margaretann Loveless, PA-C as PCP - General (Family Medicine)   Medications: Outpatient Medications Prior to Visit  Medication Sig   amLODipine (NORVASC) 10 MG tablet Take 1 tablet (10 mg total) by mouth daily.   levonorgestrel (MIRENA) 20 MCG/24HR IUD 1 each by Intrauterine route once.   lisinopril-hydrochlorothiazide (ZESTORETIC) 10-12.5 MG tablet TAKE 1 TABLET BY MOUTH DAILY   amoxicillin-clavulanate (AUGMENTIN) 875-125 MG tablet Take 1 tablet by mouth 2 (two) times daily.   No facility-administered medications prior to visit.    Review of Systems  Constitutional: Negative.   HENT: Negative.   Eyes: Negative.   Respiratory: Negative.   Cardiovascular: Negative.   Gastrointestinal: Negative.   Endocrine: Negative.   Genitourinary: Negative.   Musculoskeletal: Negative.   Skin: Negative.   Allergic/Immunologic: Negative.   Neurological: Negative.   Hematological: Negative.   Psychiatric/Behavioral: The patient is nervous/anxious.     Last CBC Lab Results  Component Value Date   WBC 6.4 12/22/2019   HGB 14.2 12/22/2019   HCT 42.4 12/22/2019   MCV 91 12/22/2019   MCH 30.4 12/22/2019   RDW 12.1 12/22/2019   PLT 246 12/22/2019   Last metabolic panel Lab Results  Component  Value Date   GLUCOSE 91 12/22/2019   NA 138 12/22/2019   K 3.9 12/22/2019   CL 101 12/22/2019   CO2 20 12/22/2019   BUN 9 12/22/2019   CREATININE 0.87 12/22/2019   GFRNONAA 87 12/22/2019   GFRAA 101 12/22/2019   CALCIUM 9.7 12/22/2019   PROT 7.4 12/22/2019   ALBUMIN 4.9 (H) 12/22/2019   LABGLOB 2.5 12/22/2019   AGRATIO 2.0 12/22/2019   BILITOT 0.6 12/22/2019   ALKPHOS 59 12/22/2019   AST 26 12/22/2019   ALT 22 12/22/2019   ANIONGAP 4 (L) 01/29/2012      Objective    BP (!) 130/93 (BP Location: Left Arm, Patient Position: Sitting, Cuff Size: Large)    Pulse 99    Temp 98.4 F (36.9 C) (Oral)    Resp 16    Ht  5\' 3"  (1.6 m)    Wt 232 lb (105.2 kg)    BMI 41.10 kg/m  BP Readings from Last 3 Encounters:  12/22/19 (!) 130/93  09/25/19 (!) 128/91  08/22/19 (!) 161/106   Wt Readings from Last 3 Encounters:  12/22/19 232 lb (105.2 kg)  09/25/19 221 lb 6.4 oz (100.4 kg)  08/22/19 218 lb (98.9 kg)      Physical Exam Vitals reviewed.  Constitutional:      General: She is not in acute distress.    Appearance: Normal appearance. She is well-developed, well-groomed and well-nourished. She is obese. She is not ill-appearing or diaphoretic.  HENT:     Head: Normocephalic and atraumatic.     Right Ear: Tympanic membrane, ear canal and external ear normal.     Left Ear: Tympanic membrane, ear canal and external ear normal.     Nose: Nose normal.     Mouth/Throat:     Mouth: Oropharynx is clear and moist. Mucous membranes are moist.     Pharynx: Oropharynx is clear. No oropharyngeal exudate or posterior oropharyngeal erythema.  Eyes:     General: No scleral icterus.       Right eye: No discharge.        Left eye: No discharge.     Extraocular Movements: Extraocular movements intact and EOM normal.     Conjunctiva/sclera: Conjunctivae normal.     Pupils: Pupils are equal, round, and reactive to light.  Neck:     Thyroid: No thyromegaly.     Vascular: No JVD.     Trachea: No tracheal deviation.  Cardiovascular:     Rate and Rhythm: Normal rate and regular rhythm.     Pulses: Normal pulses and intact distal pulses.     Heart sounds: Normal heart sounds. No murmur heard. No friction rub. No gallop.   Pulmonary:     Effort: Pulmonary effort is normal. No respiratory distress.     Breath sounds: Normal breath sounds. No wheezing or rales.  Chest:     Chest wall: No tenderness.  Breasts:     Right: Normal.     Left: Normal.    Abdominal:     General: Abdomen is flat. Bowel sounds are normal. There is no distension.     Palpations: Abdomen is soft. There is no mass.     Tenderness: There is  no abdominal tenderness. There is no guarding or rebound.  Musculoskeletal:        General: No tenderness or edema. Normal range of motion.     Cervical back: Normal range of motion and neck supple. No tenderness.  Right lower leg: No edema.     Left lower leg: No edema.  Lymphadenopathy:     Cervical: No cervical adenopathy.  Skin:    General: Skin is warm and dry.     Capillary Refill: Capillary refill takes less than 2 seconds.     Findings: No rash.  Neurological:     General: No focal deficit present.     Mental Status: She is alert and oriented to person, place, and time. Mental status is at baseline.  Psychiatric:        Mood and Affect: Mood and affect and mood normal.        Behavior: Behavior normal. Behavior is cooperative.        Thought Content: Thought content normal.        Judgment: Judgment normal.       Last depression screening scores PHQ 2/9 Scores 12/22/2019 03/16/2016  PHQ - 2 Score 0 0  PHQ- 9 Score - 2   Last fall risk screening Fall Risk  05/11/2019  Falls in the past year? 0  Number falls in past yr: 0  Injury with Fall? 0  Follow up Falls evaluation completed   Last Audit-C alcohol use screening Alcohol Use Disorder Test (AUDIT) 12/22/2019  1. How often do you have a drink containing alcohol? 2  2. How many drinks containing alcohol do you have on a typical day when you are drinking? 1  3. How often do you have six or more drinks on one occasion? 0  AUDIT-C Score 3  Alcohol Brief Interventions/Follow-up AUDIT Score <7 follow-up not indicated   A score of 3 or more in women, and 4 or more in men indicates increased risk for alcohol abuse, EXCEPT if all of the points are from question 1   No results found for any visits on 12/22/19.  Assessment & Plan    Routine Health Maintenance and Physical Exam  Exercise Activities and Dietary recommendations Goals   None      There is no immunization history on file for this patient.  Health  Maintenance  Topic Date Due   Hepatitis C Screening  Never done   COVID-19 Vaccine (1) Never done   HIV Screening  Never done   TETANUS/TDAP  Never done   INFLUENZA VACCINE  Never done   PAP SMEAR-Modifier  12/27/2020    Discussed health benefits of physical activity, and encouraged her to engage in regular exercise appropriate for her age and condition.  1. Annual physical exam Normal physical exam today. Will check labs as below and f/u pending lab results. If labs are stable and WNL she will not need to have these rechecked for one year at her next annual physical exam. She is to call the office in the meantime if she has any acute issue, questions or concerns. - CBC with Differential/Platelet - Comprehensive metabolic panel - Hemoglobin A1c - Lipid panel - TSH  2. Primary hypertension Stable. Continue Amlodipine 10mg , lisinopril-HCTZ 10-12.5mg . Will check labs as below and f/u pending results. - CBC with Differential/Platelet - Comprehensive metabolic panel - Hemoglobin A1c - Lipid panel - TSH  3. Class 3 severe obesity due to excess calories with serious comorbidity and body mass index (BMI) of 40.0 to 44.9 in adult [facility]) Followed by Bariatric clinic. Will check labs as below and f/u pending results. - CBC with Differential/Platelet - Comprehensive metabolic panel - Hemoglobin A1c - Lipid panel - TSH  4. Encounter for hepatitis C screening  test for low risk patient Will check labs as below and f/u pending results. - Hepatitis C Antibody  5. Screening for HIV without presence of risk factors Will check labs as below and f/u pending results. - HIV antibody (with reflex)   No follow-ups on file.     Delmer IslamI, Mendel Binsfeld M Desare Duddy, PA-C, have reviewed all documentation for this visit. The documentation on 01/15/20 for the exam, diagnosis, procedures, and orders are all accurate and complete.   Reine JustJennifer M Shahram Alexopoulos, PA-C  [facility] [phone]  (phone) [phone] (fax)  [facility]

## 2019-12-22 ENCOUNTER — Encounter: Payer: Self-pay | Admitting: Physician Assistant

## 2019-12-22 ENCOUNTER — Ambulatory Visit (INDEPENDENT_AMBULATORY_CARE_PROVIDER_SITE_OTHER): Payer: BC Managed Care – PPO | Admitting: Physician Assistant

## 2019-12-22 ENCOUNTER — Other Ambulatory Visit: Payer: Self-pay

## 2019-12-22 VITALS — BP 130/93 | HR 99 | Temp 98.4°F | Resp 16 | Ht 63.0 in | Wt 232.0 lb

## 2019-12-22 DIAGNOSIS — Z114 Encounter for screening for human immunodeficiency virus [HIV]: Secondary | ICD-10-CM

## 2019-12-22 DIAGNOSIS — I10 Essential (primary) hypertension: Secondary | ICD-10-CM | POA: Diagnosis not present

## 2019-12-22 DIAGNOSIS — Z6841 Body Mass Index (BMI) 40.0 and over, adult: Secondary | ICD-10-CM

## 2019-12-22 DIAGNOSIS — Z0000 Encounter for general adult medical examination without abnormal findings: Secondary | ICD-10-CM

## 2019-12-22 DIAGNOSIS — Z1159 Encounter for screening for other viral diseases: Secondary | ICD-10-CM | POA: Diagnosis not present

## 2019-12-22 NOTE — Patient Instructions (Signed)
Preventive Care 38-34 Years Old, Female Preventive care refers to visits with your health care provider and lifestyle choices that can promote health and wellness. This includes:  A yearly physical exam. This may also be called an annual well check.  Regular dental visits and eye exams.  Immunizations.  Screening for certain conditions.  Healthy lifestyle choices, such as eating a healthy diet, getting regular exercise, not using drugs or products that contain nicotine and tobacco, and limiting alcohol use. What can I expect for my preventive care visit? Physical exam Your health care provider will check your:  Height and weight. This may be used to calculate body mass index (BMI), which tells if you are at a healthy weight.  Heart rate and blood pressure.  Skin for abnormal spots. Counseling Your health care provider may ask you questions about your:  Alcohol, tobacco, and drug use.  Emotional well-being.  Home and relationship well-being.  Sexual activity.  Eating habits.  Work and work Statistician.  Method of birth control.  Menstrual cycle.  Pregnancy history. What immunizations do I need?  Influenza (flu) vaccine  This is recommended every year. Tetanus, diphtheria, and pertussis (Tdap) vaccine  You may need a Td booster every 10 years. Varicella (chickenpox) vaccine  You may need this if you have not been vaccinated. Human papillomavirus (HPV) vaccine  If recommended by your health care provider, you may need three doses over 6 months. Measles, mumps, and rubella (MMR) vaccine  You may need at least one dose of MMR. You may also need a second dose. Meningococcal conjugate (MenACWY) vaccine  One dose is recommended if you are age 53-21 years and a first-year college student living in a residence hall, or if you have one of several medical conditions. You may also need additional booster doses. Pneumococcal conjugate (PCV13) vaccine  You may need  this if you have certain conditions and were not previously vaccinated. Pneumococcal polysaccharide (PPSV23) vaccine  You may need one or two doses if you smoke cigarettes or if you have certain conditions. Hepatitis A vaccine  You may need this if you have certain conditions or if you travel or work in places where you may be exposed to hepatitis A. Hepatitis B vaccine  You may need this if you have certain conditions or if you travel or work in places where you may be exposed to hepatitis B. Haemophilus influenzae type b (Hib) vaccine  You may need this if you have certain conditions. You may receive vaccines as individual doses or as more than one vaccine together in one shot (combination vaccines). Talk with your health care provider about the risks and benefits of combination vaccines. What tests do I need?  Blood tests  Lipid and cholesterol levels. These may be checked every 5 years starting at age 64.  Hepatitis C test.  Hepatitis B test. Screening  Diabetes screening. This is done by checking your blood sugar (glucose) after you have not eaten for a while (fasting).  Sexually transmitted disease (STD) testing.  BRCA-related cancer screening. This may be done if you have a family history of breast, ovarian, tubal, or peritoneal cancers.  Pelvic exam and Pap test. This may be done every 3 years starting at age 70. Starting at age 64, this may be done every 5 years if you have a Pap test in combination with an HPV test. Talk with your health care provider about your test results, treatment options, and if necessary, the need for more tests.  Follow these instructions at home: Eating and drinking   Eat a diet that includes fresh fruits and vegetables, whole grains, lean protein, and low-fat dairy.  Take vitamin and mineral supplements as recommended by your health care provider.  Do not drink alcohol if: ? Your health care provider tells you not to drink. ? You are  pregnant, may be pregnant, or are planning to become pregnant.  If you drink alcohol: ? Limit how much you have to 0-1 drink a day. ? Be aware of how much alcohol is in your drink. In the [place] one drink equals one 12 oz bottle of beer (355 mL), one 5 oz glass of wine (148 mL), or one 1 oz glass of hard liquor (44 mL). Lifestyle  Take daily care of your teeth and gums.  Stay active. Exercise for at least 30 minutes on 5 or more days each week.  Do not use any products that contain nicotine or tobacco, such as cigarettes, e-cigarettes, and chewing tobacco. If you need help quitting, ask your health care provider.  If you are sexually active, practice safe sex. Use a condom or other form of birth control (contraception) in order to prevent pregnancy and STIs (sexually transmitted infections). If you plan to become pregnant, see your health care provider for a preconception visit. What's next?  Visit your health care provider once a year for a well check visit.  Ask your health care provider how often you should have your eyes and teeth checked.  Stay up to date on all vaccines. This information is not intended to replace advice given to you by your health care provider. Make sure you discuss any questions you have with your health care provider. Document Revised: 09/09/2017 Document Reviewed: 09/09/2017 Elsevier Patient Education  2020 [employer].

## 2019-12-23 LAB — LIPID PANEL
Chol/HDL Ratio: 2.5 ratio (ref 0.0–4.4)
Cholesterol, Total: 205 mg/dL — ABNORMAL HIGH (ref 100–199)
HDL: 82 mg/dL (ref >39)
LDL Chol Calc (NIH): 104 mg/dL — ABNORMAL HIGH (ref 0–99)
Triglycerides: 109 mg/dL (ref 0–149)
VLDL Cholesterol Cal: 19 mg/dL (ref 5–40)

## 2019-12-23 LAB — CBC WITH DIFFERENTIAL/PLATELET
Basophils Absolute: 0 10*3/uL (ref 0.0–0.2)
Basos: 1 %
EOS (ABSOLUTE): 0 10*3/uL (ref 0.0–0.4)
Eos: 0 %
Hematocrit: 42.4 % (ref 34.0–46.6)
Hemoglobin: 14.2 g/dL (ref 11.1–15.9)
Immature Grans (Abs): 0 10*3/uL (ref 0.0–0.1)
Immature Granulocytes: 1 %
Lymphocytes Absolute: 1.6 10*3/uL (ref 0.7–3.1)
Lymphs: 25 %
MCH: 30.4 pg (ref 26.6–33.0)
MCHC: 33.5 g/dL (ref 31.5–35.7)
MCV: 91 fL (ref 79–97)
Monocytes Absolute: 0.4 10*3/uL (ref 0.1–0.9)
Monocytes: 7 %
Neutrophils Absolute: 4.2 10*3/uL (ref 1.4–7.0)
Neutrophils: 66 %
Platelets: 246 10*3/uL (ref 150–450)
RBC: 4.67 x10E6/uL (ref 3.77–5.28)
RDW: 12.1 % (ref 11.7–15.4)
WBC: 6.4 10*3/uL (ref 3.4–10.8)

## 2019-12-23 LAB — COMPREHENSIVE METABOLIC PANEL
ALT: 22 IU/L (ref 0–32)
AST: 26 IU/L (ref 0–40)
Albumin/Globulin Ratio: 2 (ref 1.2–2.2)
Albumin: 4.9 g/dL — ABNORMAL HIGH (ref 3.8–4.8)
Alkaline Phosphatase: 59 IU/L (ref 44–121)
BUN/Creatinine Ratio: 10 (ref 9–23)
BUN: 9 mg/dL (ref 6–20)
Bilirubin Total: 0.6 mg/dL (ref 0.0–1.2)
CO2: 20 mmol/L (ref 20–29)
Calcium: 9.7 mg/dL (ref 8.7–10.2)
Chloride: 101 mmol/L (ref 96–106)
Creatinine, Ser: 0.87 mg/dL (ref 0.57–1.00)
GFR calc Af Amer: 101 mL/min/{1.73_m2} (ref >59)
GFR calc non Af Amer: 87 mL/min/{1.73_m2} (ref >59)
Globulin, Total: 2.5 g/dL (ref 1.5–4.5)
Glucose: 91 mg/dL (ref 65–99)
Potassium: 3.9 mmol/L (ref 3.5–5.2)
Sodium: 138 mmol/L (ref 134–144)
Total Protein: 7.4 g/dL (ref 6.0–8.5)

## 2019-12-23 LAB — TSH: TSH: 1.89 u[IU]/mL (ref 0.450–4.500)

## 2019-12-23 LAB — HEPATITIS C ANTIBODY: Hep C Virus Ab: <0.1 s/co ratio (ref 0.0–0.9)

## 2019-12-23 LAB — HEMOGLOBIN A1C
Est. average glucose Bld gHb Est-mCnc: 100 mg/dL
Hgb A1c MFr Bld: 5.1 % (ref 4.8–5.6)

## 2019-12-23 LAB — HIV ANTIBODY (ROUTINE TESTING W REFLEX): HIV Screen 4th Generation wRfx: NONREACTIVE

## 2020-02-13 ENCOUNTER — Encounter: Payer: Self-pay | Admitting: Physician Assistant

## 2020-02-15 ENCOUNTER — Telehealth: Payer: BC Managed Care – PPO | Admitting: Physician Assistant

## 2020-02-15 DIAGNOSIS — J40 Bronchitis, not specified as acute or chronic: Secondary | ICD-10-CM

## 2020-02-15 DIAGNOSIS — R059 Cough, unspecified: Secondary | ICD-10-CM

## 2020-02-15 MED ORDER — BENZONATATE 100 MG PO CAPS: 100.0000 mg | ORAL_CAPSULE | Freq: Three times a day (TID) | ORAL | 0 refills | Status: DC | PRN

## 2020-02-15 MED ORDER — ALBUTEROL SULFATE HFA 108 (90 BASE) MCG/ACT IN AERS: 2.0000 | INHALATION_SPRAY | RESPIRATORY_TRACT | 0 refills | Status: DC | PRN

## 2020-02-15 MED ORDER — PREDNISONE 10 MG PO TABS: ORAL_TABLET | ORAL | 0 refills | Status: DC

## 2020-02-15 NOTE — Progress Notes (Signed)
We are sorry that you are not feeling well.  Here is how we plan to help!  Based on your presentation I believe you most likely have A cough due to a virus.  This is called viral bronchitis and is best treated by rest, plenty of fluids and control of the cough.  You may use Ibuprofen or Tylenol as directed to help your symptoms.     In addition you may use A non-prescription cough medication called Robitussin DAC. Take 2 teaspoons every 8 hours or Delsym: take 2 teaspoons every 12 hours. and A prescription cough medication called Tessalon Perles 100mg . You may take 1-2 capsules every 8 hours as needed for your cough. I have also prescribed Albuterol inhaler, take 1-2 puffs every 4-6 hours as needed for wheezing  Prednisone 10 mg daily for 6 days (see taper instructions below)  Directions for 6 day taper: Day 1: 2 tablets before breakfast, 1 after both lunch & dinner and 2 at bedtime Day 2: 1 tab before breakfast, 1 after both lunch & dinner and 2 at bedtime Day 3: 1 tab at each meal & 1 at bedtime Day 4: 1 tab at breakfast, 1 at lunch, 1 at bedtime Day 5: 1 tab at breakfast & 1 tab at bedtime Day 6: 1 tab at breakfast   From your responses in the eVisit questionnaire you describe inflammation in the upper respiratory tract which is causing a significant cough.  This is commonly called Bronchitis and has four common causes:    Allergies  Viral Infections  Acid Reflux  Bacterial Infection Allergies, viruses and acid reflux are treated by controlling symptoms or eliminating the cause. An example might be a cough caused by taking certain blood pressure medications. You stop the cough by changing the medication. Another example might be a cough caused by acid reflux. Controlling the reflux helps control the cough.  USE OF BRONCHODILATOR ("RESCUE") INHALERS: There is a risk from using your bronchodilator too frequently.  The risk is that over-reliance on a medication which only relaxes the  muscles surrounding the breathing tubes can reduce the effectiveness of medications prescribed to reduce swelling and congestion of the tubes themselves.  Although you feel brief relief from the bronchodilator inhaler, your asthma may actually be worsening with the tubes becoming more swollen and filled with mucus.  This can delay other crucial treatments, such as oral steroid medications. If you need to use a bronchodilator inhaler daily, several times per day, you should discuss this with your provider.  There are probably better treatments that could be used to keep your asthma under control.     HOME CARE . Only take medications as instructed by your medical team. . Complete the entire course of an antibiotic. . Drink plenty of fluids and get plenty of rest. . Avoid close contacts especially the very young and the elderly . Cover your mouth if you cough or cough into your sleeve. . Always remember to wash your hands . A steam or ultrasonic humidifier can help congestion.   GET HELP RIGHT AWAY IF: . You develop worsening fever. . You become short of breath . You cough up blood. . Your symptoms persist after you have completed your treatment plan MAKE SURE YOU   Understand these instructions.  Will watch your condition.  Will get help right away if you are not doing well or get worse.  Your e-visit answers were reviewed by a board certified advanced clinical practitioner to complete your  personal care plan.  Depending on the condition, your plan could have included both over the counter or prescription medications. If there is a problem please reply  once you have received a response from your provider. Your safety is important to [place].  If you have drug allergies check your prescription carefully.    You can use MyChart to ask questions about today's visit, request a non-urgent call back, or ask for a work or school excuse for 24 hours related to this e-Visit. If it has been greater than  24 hours you will need to follow up with your provider, or enter a new e-Visit to address those concerns. You will get an e-mail in the next two days asking about your experience.  I hope that your e-visit has been valuable and will speed your recovery. Thank you for using e-visits.  Greater than 5 minutes, yet less than 10 minutes of time have been spent researching, coordinating and implementing care for this patient today.

## 2020-02-19 DIAGNOSIS — W19XXXA Unspecified fall, initial encounter: Secondary | ICD-10-CM | POA: Diagnosis not present

## 2020-02-19 DIAGNOSIS — S5012XA Contusion of left forearm, initial encounter: Secondary | ICD-10-CM | POA: Diagnosis not present

## 2020-06-17 ENCOUNTER — Encounter: Payer: Self-pay | Admitting: Physician Assistant

## 2020-07-02 ENCOUNTER — Other Ambulatory Visit: Payer: Self-pay

## 2020-07-02 ENCOUNTER — Ambulatory Visit (INDEPENDENT_AMBULATORY_CARE_PROVIDER_SITE_OTHER): Payer: BC Managed Care – PPO | Admitting: Family Medicine

## 2020-07-02 ENCOUNTER — Encounter: Payer: Self-pay | Admitting: Family Medicine

## 2020-07-02 VITALS — BP 128/109 | HR 88 | Wt 238.0 lb

## 2020-07-02 DIAGNOSIS — F4323 Adjustment disorder with mixed anxiety and depressed mood: Secondary | ICD-10-CM | POA: Diagnosis not present

## 2020-07-02 DIAGNOSIS — I10 Essential (primary) hypertension: Secondary | ICD-10-CM

## 2020-07-02 DIAGNOSIS — Z6841 Body Mass Index (BMI) 40.0 and over, adult: Secondary | ICD-10-CM | POA: Diagnosis not present

## 2020-07-02 MED ORDER — SERTRALINE HCL 50 MG PO TABS: 50.0000 mg | ORAL_TABLET | Freq: Every day | ORAL | 3 refills | Status: DC

## 2020-07-02 MED ORDER — AMLODIPINE BESYLATE 10 MG PO TABS: 10.0000 mg | ORAL_TABLET | Freq: Every day | ORAL | 3 refills | Status: DC

## 2020-07-02 NOTE — Progress Notes (Signed)
Established patient visit   Patient: Allison Valdez   DOB: [birth year]   35 y.o. Female  MRN: [id number] Visit Date: 07/02/2020  Today's healthcare provider: Dortha Kern, PA-C   No chief complaint on file.  Subjective    HPI  Patient is a 35 year old female who presents today for evaluation of her lack of concentration. She states she is falling asleep easily but does not stay asleep.  She is anxious and can't concentrate on anything.  She has frequent headaches which is chronic and unchanged.   She reports that she has had a history of anxiety but never had it treated and feels she can't control it herself anymore.    She admits that she has not been taking her Amlodipine as she has been out for a while now.   Past Medical History:  Diagnosis Date   Anxiety    Depression    Hypertension    Obesity    Vitamin D deficiency disease    Past Surgical History:  Procedure Laterality Date   OVARIAN CYST REMOVAL Right    Social History   Tobacco Use   Smoking status: Never   Smokeless tobacco: Never  Vaping Use   Vaping Use: Never used  Substance Use Topics   Alcohol use: Yes    Comment: occas   Drug use: No   Family Status  Relation Name Status   Mother  Alive   Father  Deceased   No Known Allergies     Medications: Outpatient Medications Prior to Visit  Medication Sig   levonorgestrel (MIRENA) 20 MCG/24HR IUD 1 each by Intrauterine route once.   lisinopril-hydrochlorothiazide (ZESTORETIC) 10-12.5 MG tablet TAKE 1 TABLET BY MOUTH DAILY   albuterol (VENTOLIN HFA) 108 (90 Base) MCG/ACT inhaler Inhale 2 puffs into the lungs every 4 (four) hours as needed for wheezing or shortness of breath (cough, shortness of breath or wheezing.).   amLODipine (NORVASC) 10 MG tablet Take 1 tablet (10 mg total) by mouth daily. (Patient not taking: Reported on 07/02/2020)   benzonatate (TESSALON) 100 MG capsule Take 1-2 capsules (100-200 mg total) by mouth 3 (three) times  daily as needed for cough.   predniSONE (DELTASONE) 10 MG tablet Day 1: 2 tablets before breakfast, 1 after both lunch & dinner and 2 at bedtime; Day 2: 1 tab before breakfast, 1 after both lunch & dinner and 2 at bedtime; Day 3: 1 tab at each meal & 1 at bedtime; Day 4: 1 tab at breakfast, 1 at lunch, 1 at bedtime; Day 5: 1 tab at breakfast & 1 tab at bedtime; Day 6: 1 tab at breakfast   No facility-administered medications prior to visit.    Review of Systems  Eyes:  Negative for visual disturbance.  Respiratory:  Negative for shortness of breath.   Cardiovascular:  Negative for chest pain.  Neurological:  Positive for headaches. Negative for dizziness, light-headedness and numbness.  Psychiatric/Behavioral:  Positive for decreased concentration and sleep disturbance. Negative for dysphoric mood, hallucinations and self-injury. The patient is nervous/anxious.    Last CBC Lab Results  Component Value Date   WBC 6.4 12/22/2019   HGB 14.2 12/22/2019   HCT 42.4 12/22/2019   MCV 91 12/22/2019   MCH 30.4 12/22/2019   RDW 12.1 12/22/2019   PLT 246 12/22/2019   Last metabolic panel Lab Results  Component Value Date   GLUCOSE 91 12/22/2019   NA 138 12/22/2019   K 3.9  12/22/2019   CL 101 12/22/2019   CO2 20 12/22/2019   BUN 9 12/22/2019   CREATININE 0.87 12/22/2019   GFRNONAA 87 12/22/2019   GFRAA 101 12/22/2019   CALCIUM 9.7 12/22/2019   PROT 7.4 12/22/2019   ALBUMIN 4.9 (H) 12/22/2019   LABGLOB 2.5 12/22/2019   AGRATIO 2.0 12/22/2019   BILITOT 0.6 12/22/2019   ALKPHOS 59 12/22/2019   AST 26 12/22/2019   ALT 22 12/22/2019   ANIONGAP 4 (L) 01/29/2012   Last hemoglobin A1c Lab Results  Component Value Date   HGBA1C 5.1 12/22/2019   Last thyroid functions Lab Results  Component Value Date   TSH 1.890 12/22/2019       Objective    BP (!) 128/109 (BP Location: Right Arm, Patient Position: Sitting, Cuff Size: Large)   Pulse 88   Wt 238 lb (108 kg)   SpO2 99%   BMI  42.16 kg/m  BP Readings from Last 3 Encounters:  07/02/20 (!) 128/109  12/22/19 (!) 130/93  09/25/19 (!) 128/91   Wt Readings from Last 3 Encounters:  07/02/20 238 lb (108 kg)  12/22/19 232 lb (105.2 kg)  09/25/19 221 lb 6.4 oz (100.4 kg)       Physical Exam Constitutional:      General: She is not in acute distress.    Appearance: She is well-developed.  HENT:     Head: Normocephalic and atraumatic.     Right Ear: Hearing and tympanic membrane normal.     Left Ear: Hearing and tympanic membrane normal.     Nose: Nose normal.     Mouth/Throat:     Mouth: Mucous membranes are moist.     Pharynx: Oropharynx is clear.  Eyes:     General: Lids are normal. No scleral icterus.       Right eye: No discharge.        Left eye: No discharge.     Conjunctiva/sclera: Conjunctivae normal.  Cardiovascular:     Rate and Rhythm: Normal rate and regular rhythm.     Heart sounds: Normal heart sounds.  Pulmonary:     Effort: Pulmonary effort is normal. No respiratory distress.     Breath sounds: Normal breath sounds.  Abdominal:     General: Bowel sounds are normal.     Palpations: Abdomen is soft.  Musculoskeletal:        General: Normal range of motion.     Cervical back: Normal range of motion and neck supple.  Skin:    Findings: No lesion or rash.  Neurological:     Mental Status: She is alert and oriented to person, place, and time.  Psychiatric:        Mood and Affect: Mood is anxious and depressed.        Speech: Speech normal.        Behavior: Behavior normal.        Thought Content: Thought content normal.     No results found for any visits on 07/02/20.  Assessment & Plan     1. Essential hypertension BP out of control and may be associated with anxiety disorder. Has been out ot the Norvasc for the past 2 weeks. Still taking the Zestoretic 10-12.5 mg qd. Reduce salt intake and limit caffeine. Refill Norvasc and check follow up labs. Monitor BP at home. - amLODipine  (NORVASC) 10 MG tablet; Take 1 tablet (10 mg total) by mouth daily.  Dispense: 90 tablet; Refill: 3 - CBC with Differential/Platelet -  Comprehensive metabolic panel - TSH  2. Class 3 severe obesity due to excess calories with serious comorbidity and body mass index (BMI) of 40.0 to 44.9 in adult [facility]) Admits to some emotional eating and regaining weight. Will get follow up labs and recommend exercise with lower caloric intake.  - CBC with Differential/Platelet - Comprehensive metabolic panel - Hemoglobin A1c - TSH  3. Adjustment disorder with mixed anxiety and depressed mood Recent increase in anxiety/near panic. No specific trigger acknowledged. Waking for an hour or two at night. Will start Zoloft 50 mg hs and check labs for metabolic disorders. Recheck pending reports. - sertraline (ZOLOFT) 50 MG tablet; Take 1 tablet (50 mg total) by mouth daily.  Dispense: 30 tablet; Refill: 3 - CBC with Differential/Platelet - Comprehensive metabolic panel - TSH   No follow-ups on file.      I, Valeri Sula, PA-C, have reviewed all documentation for this visit. The documentation on 07/02/20 for the exam, diagnosis, procedures, and orders are all accurate and complete.    Dortha Kern, PA-C  [facility] [phone] (phone) [phone] (fax)  [facility]

## 2020-07-05 DIAGNOSIS — F4323 Adjustment disorder with mixed anxiety and depressed mood: Secondary | ICD-10-CM | POA: Diagnosis not present

## 2020-07-05 DIAGNOSIS — I10 Essential (primary) hypertension: Secondary | ICD-10-CM | POA: Diagnosis not present

## 2020-07-05 DIAGNOSIS — Z6841 Body Mass Index (BMI) 40.0 and over, adult: Secondary | ICD-10-CM | POA: Diagnosis not present

## 2020-07-06 LAB — HEMOGLOBIN A1C
Est. average glucose Bld gHb Est-mCnc: 100 mg/dL
Hgb A1c MFr Bld: 5.1 % (ref 4.8–5.6)

## 2020-07-06 LAB — TSH: TSH: 2.57 u[IU]/mL (ref 0.450–4.500)

## 2020-07-06 LAB — CBC WITH DIFFERENTIAL/PLATELET
Basophils Absolute: 0 10*3/uL (ref 0.0–0.2)
Basos: 1 %
EOS (ABSOLUTE): 0.1 10*3/uL (ref 0.0–0.4)
Eos: 1 %
Hematocrit: 40.8 % (ref 34.0–46.6)
Hemoglobin: 13.8 g/dL (ref 11.1–15.9)
Immature Grans (Abs): 0 10*3/uL (ref 0.0–0.1)
Immature Granulocytes: 1 %
Lymphocytes Absolute: 1.9 10*3/uL (ref 0.7–3.1)
Lymphs: 35 %
MCH: 31.7 pg (ref 26.6–33.0)
MCHC: 33.8 g/dL (ref 31.5–35.7)
MCV: 94 fL (ref 79–97)
Monocytes Absolute: 0.4 10*3/uL (ref 0.1–0.9)
Monocytes: 7 %
Neutrophils Absolute: 3 10*3/uL (ref 1.4–7.0)
Neutrophils: 55 %
Platelets: 210 10*3/uL (ref 150–450)
RBC: 4.35 x10E6/uL (ref 3.77–5.28)
RDW: 12.7 % (ref 11.7–15.4)
WBC: 5.4 10*3/uL (ref 3.4–10.8)

## 2020-07-06 LAB — COMPREHENSIVE METABOLIC PANEL
ALT: 43 IU/L — ABNORMAL HIGH (ref 0–32)
AST: 45 IU/L — ABNORMAL HIGH (ref 0–40)
Albumin/Globulin Ratio: 2.2 (ref 1.2–2.2)
Albumin: 4.6 g/dL (ref 3.8–4.8)
Alkaline Phosphatase: 55 IU/L (ref 44–121)
BUN/Creatinine Ratio: 14 (ref 9–23)
BUN: 11 mg/dL (ref 6–20)
Bilirubin Total: 0.6 mg/dL (ref 0.0–1.2)
CO2: 19 mmol/L — ABNORMAL LOW (ref 20–29)
Calcium: 9.4 mg/dL (ref 8.7–10.2)
Chloride: 101 mmol/L (ref 96–106)
Creatinine, Ser: 0.78 mg/dL (ref 0.57–1.00)
Globulin, Total: 2.1 g/dL (ref 1.5–4.5)
Glucose: 91 mg/dL (ref 65–99)
Potassium: 4.1 mmol/L (ref 3.5–5.2)
Sodium: 137 mmol/L (ref 134–144)
Total Protein: 6.7 g/dL (ref 6.0–8.5)
eGFR: 102 mL/min/{1.73_m2} (ref >59)

## 2020-07-08 ENCOUNTER — Encounter: Payer: Self-pay | Admitting: Family Medicine

## 2020-09-18 DIAGNOSIS — M545 Low back pain, unspecified: Secondary | ICD-10-CM | POA: Diagnosis not present

## 2020-09-18 DIAGNOSIS — M4316 Spondylolisthesis, lumbar region: Secondary | ICD-10-CM | POA: Diagnosis not present

## 2020-09-20 ENCOUNTER — Other Ambulatory Visit: Payer: Self-pay

## 2020-09-20 ENCOUNTER — Emergency Department
Admission: EM | Admit: 2020-09-20 | Discharge: 2020-09-20 | Disposition: A | Discharge status: Home / Self Care | Payer: BC Managed Care – PPO | Attending: Emergency Medicine | Admitting: Emergency Medicine

## 2020-09-20 ENCOUNTER — Emergency Department: Payer: BC Managed Care – PPO

## 2020-09-20 DIAGNOSIS — I10 Essential (primary) hypertension: Secondary | ICD-10-CM | POA: Diagnosis not present

## 2020-09-20 DIAGNOSIS — Z79899 Other long term (current) drug therapy: Secondary | ICD-10-CM | POA: Insufficient documentation

## 2020-09-20 DIAGNOSIS — M47817 Spondylosis without myelopathy or radiculopathy, lumbosacral region: Secondary | ICD-10-CM

## 2020-09-20 DIAGNOSIS — M51379 Other intervertebral disc degeneration, lumbosacral region without mention of lumbar back pain or lower extremity pain: Secondary | ICD-10-CM

## 2020-09-20 DIAGNOSIS — M5127 Other intervertebral disc displacement, lumbosacral region: Secondary | ICD-10-CM | POA: Diagnosis not present

## 2020-09-20 DIAGNOSIS — M5137 Other intervertebral disc degeneration, lumbosacral region: Secondary | ICD-10-CM

## 2020-09-20 DIAGNOSIS — M545 Low back pain, unspecified: Secondary | ICD-10-CM | POA: Diagnosis not present

## 2020-09-20 DIAGNOSIS — R200 Anesthesia of skin: Secondary | ICD-10-CM | POA: Insufficient documentation

## 2020-09-20 DIAGNOSIS — R519 Headache, unspecified: Secondary | ICD-10-CM | POA: Diagnosis not present

## 2020-09-20 DIAGNOSIS — M542 Cervicalgia: Secondary | ICD-10-CM | POA: Diagnosis not present

## 2020-09-20 DIAGNOSIS — M5136 Other intervertebral disc degeneration, lumbar region: Secondary | ICD-10-CM | POA: Diagnosis not present

## 2020-09-20 MED ORDER — PREDNISONE 20 MG PO TABS: 40.0000 mg | ORAL_TABLET | Freq: Every day | ORAL | 0 refills | Status: AC

## 2020-09-20 MED ORDER — LIDOCAINE 5 % EX PTCH
1.0000 | MEDICATED_PATCH | Freq: Two times a day (BID) | CUTANEOUS | 0 refills | Status: AC | PRN
Start: 2020-09-20 — End: 2020-09-30

## 2020-09-20 MED ORDER — LIDOCAINE 5 % EX PTCH
1.0000 | MEDICATED_PATCH | Freq: Once | CUTANEOUS | Status: DC
  Administered 2020-09-20: 1 via TRANSDERMAL
  Filled 2020-09-20: qty 1
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
## 2020-09-20 NOTE — Discharge Instructions (Addendum)
Your exam and CT of the head and neck are negative and reassuring.  Your lumbar x-ray confirms moderate to severe degenerative disc disease and facet arthritis.  No acute fractures are seen.  There is nerve compression consistent with your complaints of bilateral foot tingling.  You should take the prescription steroid along with the other prescribed medications as directed.  Follow-up with your Ortho provider for ongoing management.  Return to the ED if needed.

## 2020-09-23 ENCOUNTER — Other Ambulatory Visit: Payer: Self-pay | Admitting: Physician Assistant

## 2020-09-23 DIAGNOSIS — I10 Essential (primary) hypertension: Secondary | ICD-10-CM

## 2020-09-24 DIAGNOSIS — M545 Low back pain, unspecified: Secondary | ICD-10-CM | POA: Diagnosis not present

## 2020-09-24 DIAGNOSIS — M4316 Spondylolisthesis, lumbar region: Secondary | ICD-10-CM | POA: Diagnosis not present

## 2020-10-01 DIAGNOSIS — M5442 Lumbago with sciatica, left side: Secondary | ICD-10-CM | POA: Diagnosis not present

## 2020-10-01 DIAGNOSIS — M48062 Spinal stenosis, lumbar region with neurogenic claudication: Secondary | ICD-10-CM | POA: Diagnosis not present

## 2020-10-01 DIAGNOSIS — M5441 Lumbago with sciatica, right side: Secondary | ICD-10-CM | POA: Diagnosis not present

## 2020-10-01 DIAGNOSIS — G8929 Other chronic pain: Secondary | ICD-10-CM | POA: Diagnosis not present

## 2020-10-04 DIAGNOSIS — M48062 Spinal stenosis, lumbar region with neurogenic claudication: Secondary | ICD-10-CM | POA: Diagnosis not present

## 2020-10-04 DIAGNOSIS — G8929 Other chronic pain: Secondary | ICD-10-CM | POA: Diagnosis not present

## 2020-10-04 DIAGNOSIS — M5441 Lumbago with sciatica, right side: Secondary | ICD-10-CM | POA: Diagnosis not present

## 2020-10-04 DIAGNOSIS — M5442 Lumbago with sciatica, left side: Secondary | ICD-10-CM | POA: Diagnosis not present

## 2020-10-22 DIAGNOSIS — M5442 Lumbago with sciatica, left side: Secondary | ICD-10-CM | POA: Diagnosis not present

## 2020-10-22 DIAGNOSIS — G8929 Other chronic pain: Secondary | ICD-10-CM | POA: Diagnosis not present

## 2020-10-22 DIAGNOSIS — M5441 Lumbago with sciatica, right side: Secondary | ICD-10-CM | POA: Diagnosis not present

## 2020-10-22 DIAGNOSIS — M48062 Spinal stenosis, lumbar region with neurogenic claudication: Secondary | ICD-10-CM | POA: Diagnosis not present

## 2020-10-29 ENCOUNTER — Telehealth: Payer: BC Managed Care – PPO | Admitting: Physician Assistant

## 2020-10-29 DIAGNOSIS — J019 Acute sinusitis, unspecified: Secondary | ICD-10-CM

## 2020-10-29 MED ORDER — AMOXICILLIN-POT CLAVULANATE 875-125 MG PO TABS
1.0000 | ORAL_TABLET | Freq: Two times a day (BID) | ORAL | 0 refills | Status: AC
Start: 2020-10-29 — End: 2020-11-05

## 2020-10-29 NOTE — Progress Notes (Signed)

## 2020-10-31 DIAGNOSIS — M5441 Lumbago with sciatica, right side: Secondary | ICD-10-CM | POA: Diagnosis not present

## 2020-10-31 DIAGNOSIS — G8929 Other chronic pain: Secondary | ICD-10-CM | POA: Diagnosis not present

## 2020-10-31 DIAGNOSIS — M48062 Spinal stenosis, lumbar region with neurogenic claudication: Secondary | ICD-10-CM | POA: Diagnosis not present

## 2020-10-31 DIAGNOSIS — M5442 Lumbago with sciatica, left side: Secondary | ICD-10-CM | POA: Diagnosis not present

## 2020-11-05 DIAGNOSIS — M5441 Lumbago with sciatica, right side: Secondary | ICD-10-CM | POA: Diagnosis not present

## 2020-11-05 DIAGNOSIS — M5442 Lumbago with sciatica, left side: Secondary | ICD-10-CM | POA: Diagnosis not present

## 2020-11-05 DIAGNOSIS — M7061 Trochanteric bursitis, right hip: Secondary | ICD-10-CM | POA: Diagnosis not present

## 2020-11-05 DIAGNOSIS — G8929 Other chronic pain: Secondary | ICD-10-CM | POA: Diagnosis not present

## 2020-11-13 DIAGNOSIS — M5442 Lumbago with sciatica, left side: Secondary | ICD-10-CM | POA: Diagnosis not present

## 2020-11-13 DIAGNOSIS — M48062 Spinal stenosis, lumbar region with neurogenic claudication: Secondary | ICD-10-CM | POA: Diagnosis not present

## 2020-11-13 DIAGNOSIS — G8929 Other chronic pain: Secondary | ICD-10-CM | POA: Diagnosis not present

## 2020-11-13 DIAGNOSIS — M5441 Lumbago with sciatica, right side: Secondary | ICD-10-CM | POA: Diagnosis not present

## 2020-11-14 DIAGNOSIS — M5441 Lumbago with sciatica, right side: Secondary | ICD-10-CM | POA: Diagnosis not present

## 2020-11-14 DIAGNOSIS — M5442 Lumbago with sciatica, left side: Secondary | ICD-10-CM | POA: Diagnosis not present

## 2020-11-14 DIAGNOSIS — G8929 Other chronic pain: Secondary | ICD-10-CM | POA: Diagnosis not present

## 2020-11-21 DIAGNOSIS — M5441 Lumbago with sciatica, right side: Secondary | ICD-10-CM | POA: Diagnosis not present

## 2020-11-21 DIAGNOSIS — M5442 Lumbago with sciatica, left side: Secondary | ICD-10-CM | POA: Diagnosis not present

## 2020-11-21 DIAGNOSIS — G8929 Other chronic pain: Secondary | ICD-10-CM | POA: Diagnosis not present

## 2020-11-26 DIAGNOSIS — M4310 Spondylolisthesis, site unspecified: Secondary | ICD-10-CM | POA: Diagnosis not present

## 2020-11-26 DIAGNOSIS — M5441 Lumbago with sciatica, right side: Secondary | ICD-10-CM | POA: Diagnosis not present

## 2020-11-26 DIAGNOSIS — M5442 Lumbago with sciatica, left side: Secondary | ICD-10-CM | POA: Diagnosis not present

## 2020-12-12 DIAGNOSIS — I10 Essential (primary) hypertension: Secondary | ICD-10-CM | POA: Diagnosis not present

## 2020-12-12 DIAGNOSIS — Z6838 Body mass index (BMI) 38.0-38.9, adult: Secondary | ICD-10-CM | POA: Diagnosis not present

## 2020-12-12 DIAGNOSIS — M4316 Spondylolisthesis, lumbar region: Secondary | ICD-10-CM | POA: Diagnosis not present

## 2020-12-24 DIAGNOSIS — M4316 Spondylolisthesis, lumbar region: Secondary | ICD-10-CM | POA: Diagnosis not present

## 2020-12-24 DIAGNOSIS — M545 Low back pain, unspecified: Secondary | ICD-10-CM | POA: Diagnosis not present

## 2020-12-24 DIAGNOSIS — M47816 Spondylosis without myelopathy or radiculopathy, lumbar region: Secondary | ICD-10-CM | POA: Diagnosis not present

## 2020-12-31 DIAGNOSIS — I10 Essential (primary) hypertension: Secondary | ICD-10-CM | POA: Diagnosis not present

## 2020-12-31 DIAGNOSIS — M4316 Spondylolisthesis, lumbar region: Secondary | ICD-10-CM | POA: Diagnosis not present

## 2020-12-31 DIAGNOSIS — Z6837 Body mass index (BMI) 37.0-37.9, adult: Secondary | ICD-10-CM | POA: Diagnosis not present

## 2021-02-11 DIAGNOSIS — Z01818 Encounter for other preprocedural examination: Secondary | ICD-10-CM | POA: Diagnosis not present

## 2021-02-11 DIAGNOSIS — M4316 Spondylolisthesis, lumbar region: Secondary | ICD-10-CM | POA: Diagnosis not present

## 2021-02-12 DIAGNOSIS — M4316 Spondylolisthesis, lumbar region: Secondary | ICD-10-CM | POA: Diagnosis not present

## 2021-02-17 DIAGNOSIS — M4316 Spondylolisthesis, lumbar region: Secondary | ICD-10-CM | POA: Diagnosis not present

## 2021-03-20 ENCOUNTER — Other Ambulatory Visit: Payer: Self-pay | Admitting: Family Medicine

## 2021-03-20 DIAGNOSIS — I10 Essential (primary) hypertension: Secondary | ICD-10-CM

## 2021-03-20 NOTE — Telephone Encounter (Signed)
[facility] faxed refill request for the following medications: ? ?amLODipine (NORVASC) 10 MG tablet  ? ?Please advise. ? ? ?[facility]  ?[address]  ?[place] [id number] ? ?

## 2021-04-01 DIAGNOSIS — F900 Attention-deficit hyperactivity disorder, predominantly inattentive type: Secondary | ICD-10-CM | POA: Diagnosis not present

## 2021-04-01 DIAGNOSIS — F411 Generalized anxiety disorder: Secondary | ICD-10-CM | POA: Diagnosis not present

## 2021-04-01 DIAGNOSIS — F3489 Other specified persistent mood disorders: Secondary | ICD-10-CM | POA: Diagnosis not present

## 2021-04-01 DIAGNOSIS — F331 Major depressive disorder, recurrent, moderate: Secondary | ICD-10-CM | POA: Diagnosis not present

## 2021-04-08 DIAGNOSIS — M4316 Spondylolisthesis, lumbar region: Secondary | ICD-10-CM | POA: Diagnosis not present

## 2021-04-21 DIAGNOSIS — F331 Major depressive disorder, recurrent, moderate: Secondary | ICD-10-CM | POA: Diagnosis not present

## 2021-04-21 DIAGNOSIS — F3489 Other specified persistent mood disorders: Secondary | ICD-10-CM | POA: Diagnosis not present

## 2021-04-21 DIAGNOSIS — F411 Generalized anxiety disorder: Secondary | ICD-10-CM | POA: Diagnosis not present

## 2021-04-21 DIAGNOSIS — F900 Attention-deficit hyperactivity disorder, predominantly inattentive type: Secondary | ICD-10-CM | POA: Diagnosis not present

## 2021-05-07 DIAGNOSIS — F331 Major depressive disorder, recurrent, moderate: Secondary | ICD-10-CM | POA: Diagnosis not present

## 2021-05-07 DIAGNOSIS — F3489 Other specified persistent mood disorders: Secondary | ICD-10-CM | POA: Diagnosis not present

## 2021-05-07 DIAGNOSIS — F411 Generalized anxiety disorder: Secondary | ICD-10-CM | POA: Diagnosis not present

## 2021-05-07 DIAGNOSIS — F900 Attention-deficit hyperactivity disorder, predominantly inattentive type: Secondary | ICD-10-CM | POA: Diagnosis not present

## 2021-05-20 DIAGNOSIS — M4316 Spondylolisthesis, lumbar region: Secondary | ICD-10-CM | POA: Diagnosis not present

## 2021-05-28 DIAGNOSIS — F411 Generalized anxiety disorder: Secondary | ICD-10-CM | POA: Diagnosis not present

## 2021-05-28 DIAGNOSIS — F900 Attention-deficit hyperactivity disorder, predominantly inattentive type: Secondary | ICD-10-CM | POA: Diagnosis not present

## 2021-05-28 DIAGNOSIS — F331 Major depressive disorder, recurrent, moderate: Secondary | ICD-10-CM | POA: Diagnosis not present

## 2021-05-28 DIAGNOSIS — F3489 Other specified persistent mood disorders: Secondary | ICD-10-CM | POA: Diagnosis not present

## 2021-06-26 DIAGNOSIS — F900 Attention-deficit hyperactivity disorder, predominantly inattentive type: Secondary | ICD-10-CM | POA: Diagnosis not present

## 2021-06-26 DIAGNOSIS — F411 Generalized anxiety disorder: Secondary | ICD-10-CM | POA: Diagnosis not present

## 2021-06-26 DIAGNOSIS — F3489 Other specified persistent mood disorders: Secondary | ICD-10-CM | POA: Diagnosis not present

## 2021-06-26 DIAGNOSIS — F331 Major depressive disorder, recurrent, moderate: Secondary | ICD-10-CM | POA: Diagnosis not present

## 2021-07-02 ENCOUNTER — Encounter: Payer: Self-pay | Admitting: Physician Assistant

## 2021-07-02 ENCOUNTER — Ambulatory Visit (INDEPENDENT_AMBULATORY_CARE_PROVIDER_SITE_OTHER): Payer: BC Managed Care – PPO | Admitting: Physician Assistant

## 2021-07-02 VITALS — BP 116/84 | HR 82 | Ht 63.0 in | Wt 180.0 lb

## 2021-07-02 DIAGNOSIS — R634 Abnormal weight loss: Secondary | ICD-10-CM | POA: Insufficient documentation

## 2021-07-02 DIAGNOSIS — R748 Abnormal levels of other serum enzymes: Secondary | ICD-10-CM | POA: Insufficient documentation

## 2021-07-02 DIAGNOSIS — Z0000 Encounter for general adult medical examination without abnormal findings: Secondary | ICD-10-CM

## 2021-07-02 DIAGNOSIS — I10 Essential (primary) hypertension: Secondary | ICD-10-CM

## 2021-07-02 DIAGNOSIS — E782 Mixed hyperlipidemia: Secondary | ICD-10-CM

## 2021-07-02 DIAGNOSIS — Z124 Encounter for screening for malignant neoplasm of cervix: Secondary | ICD-10-CM

## 2021-07-02 MED ORDER — LISINOPRIL 10 MG PO TABS: 10.0000 mg | ORAL_TABLET | Freq: Every day | ORAL | 1 refills | Status: DC

## 2021-07-02 NOTE — Assessment & Plan Note (Signed)
historicaly will check fasting lipids

## 2021-07-02 NOTE — Assessment & Plan Note (Signed)
Currently using mounjaro. D/t significant weight loss in short period of time, will check tsh/t4 to ensure continues to be normal on medication

## 2021-07-02 NOTE — Progress Notes (Signed)
I,[name],acting as a Education administrator for [employer], PA-C.,have documented all relevant documentation on the behalf of Allison Kirschner, PA-C,as directed by  Allison Kirschner, PA-C while in the presence of Allison Kirschner, PA-C.   Complete physical exam   Patient: Allison Valdez   DOB: 05/11/1985   36 y.o. Female  MRN: [id number] Visit Date: 07/02/2021  Today's healthcare provider: [name], PA-C   Cc. cpe  Subjective    Allison Valdez is a 36 y.o. female who presents today for a complete physical exam.  She reports consuming a general and try to do low carb  diet. The patient does not participate in regular exercise at present. She generally feels well. She reports sleeping well. She does not have additional problems to discuss today.  HPI   Currently taking Mounjaro for weight management, started in September and has lost 63 lbs.   Currently takes Trazodone 50 mg daily 5/7 days a week for sleep.  Had previously stopped all BP meds as her pressure was running lower at home.   Past Medical History:  Diagnosis Date   Anxiety    Depression    Hypertension    Obesity    Vitamin D deficiency disease    Past Surgical History:  Procedure Laterality Date   OVARIAN CYST REMOVAL Right    SPINE SURGERY     2/23 lumbar fusion   Social History   Socioeconomic History   Marital status: Married    Spouse name: Not on file   Number of children: Not on file   Years of education: Not on file   Highest education level: Not on file  Occupational History   Not on file  Tobacco Use   Smoking status: Never   Smokeless tobacco: Never  Vaping Use   Vaping Use: Never used  Substance and Sexual Activity   Alcohol use: Yes    Comment: occas   Drug use: No   Sexual activity: Yes    Birth control/protection: I.U.D.    Comment: Mirena  Other Topics Concern   Not on file  Social History Narrative   Not on file   Social Determinants of Health   Financial  Resource Strain: Not on file  Food Insecurity: Not on file  Transportation Needs: Not on file  Physical Activity: Not on file  Stress: Not on file  Social Connections: Not on file  Intimate Partner Violence: Not on file   Family Status  Relation Name Status   Mother  Alive   Father  Deceased   Family History  Problem Relation Age of Onset   Diabetes Mother    Diabetes Father    No Known Allergies  Patient Care Team: Allison Kirschner, PA-C as PCP - General (Physician Assistant)   Medications: Outpatient Medications Prior to Visit  Medication Sig   levonorgestrel (MIRENA) 20 MCG/24HR IUD 1 each by Intrauterine route once.   methylphenidate (METADATE ER) 20 MG ER tablet Take 40 mg by mouth daily.   MOUNJARO 12.5 MG/0.5ML Pen Inject into the skin once a week.   traZODone (DESYREL) 50 MG tablet Take 25-50 mg by mouth at bedtime as needed.   [DISCONTINUED] albuterol (VENTOLIN HFA) 108 (90 Base) MCG/ACT inhaler Inhale 2 puffs into the lungs every 4 (four) hours as needed for wheezing or shortness of breath (cough, shortness of breath or wheezing.).   [DISCONTINUED] amLODipine (NORVASC) 10 MG tablet Take 1 tablet (10 mg total) by mouth daily.   [  DISCONTINUED] escitalopram (LEXAPRO) 10 MG tablet Take 10 mg by mouth. (Patient not taking: Reported on 07/02/2021)   [DISCONTINUED] hydrOXYzine (ATARAX) 10 MG tablet Take 5-10 mg by mouth daily as needed. (Patient not taking: Reported on 07/02/2021)   [DISCONTINUED] lisinopril-hydrochlorothiazide (ZESTORETIC) 10-12.5 MG tablet Take 1 tablet by mouth daily. Pt needs appt for any additional refills.   No facility-administered medications prior to visit.    Review of Systems  Constitutional:  Negative for fatigue and fever.  Respiratory:  Negative for cough and shortness of breath.   Cardiovascular:  Negative for chest pain and leg swelling.  Gastrointestinal:  Negative for abdominal pain.  Neurological:  Negative for dizziness and headaches.       Objective     Blood pressure 116/84, pulse 82, height 5\' 3"  (1.6 m), weight 180 lb (81.6 kg), SpO2 99 %.    Physical Exam Constitutional:      General: She is awake.     Appearance: She is well-developed. She is not ill-appearing.  HENT:     Head: Normocephalic.     Right Ear: Tympanic membrane normal.     Left Ear: Tympanic membrane normal.     Nose: Nose normal. No congestion or rhinorrhea.     Mouth/Throat:     Pharynx: No oropharyngeal exudate or posterior oropharyngeal erythema.  Eyes:     Conjunctiva/sclera: Conjunctivae normal.     Pupils: Pupils are equal, round, and reactive to light.  Neck:     Thyroid: No thyroid mass or thyromegaly.  Cardiovascular:     Rate and Rhythm: Normal rate and regular rhythm.     Heart sounds: Normal heart sounds.  Pulmonary:     Effort: Pulmonary effort is normal.     Breath sounds: Normal breath sounds.  Abdominal:     Palpations: Abdomen is soft.     Tenderness: There is no abdominal tenderness.  Genitourinary:    Labia:        Right: No rash or lesion.        Left: No rash or lesion.      Vagina: Normal.     Cervix: Normal.     Adnexa: Right adnexa normal and left adnexa normal.     Comments: IUD strings present Musculoskeletal:     Right lower leg: No swelling. No edema.     Left lower leg: No swelling. No edema.  Lymphadenopathy:     Cervical: No cervical adenopathy.  Skin:    General: Skin is warm.  Neurological:     Mental Status: She is alert and oriented to person, place, and time.  Psychiatric:        Attention and Perception: Attention normal.        Mood and Affect: Mood normal.        Speech: Speech normal.        Behavior: Behavior normal. Behavior is cooperative.     Last depression screening scores    07/02/2021   11:09 AM 07/02/2020    4:28 PM 12/22/2019    9:29 AM  PHQ 2/9 Scores  PHQ - 2 Score 0 0 0  PHQ- 9 Score 0 11    Last fall risk screening    07/02/2020    4:28 PM  Fall Risk    Falls in the past year? 0  Number falls in past yr: 0  Injury with Fall? 0   Last Audit-C alcohol use screening    07/02/2021   11:09 AM  Alcohol  Use Disorder Test (AUDIT)  1. How often do you have a drink containing alcohol? 3  2. How many drinks containing alcohol do you have on a typical day when you are drinking? 1  3. How often do you have six or more drinks on one occasion? 1  AUDIT-C Score 5   A score of 3 or more in women, and 4 or more in men indicates increased risk for alcohol abuse, EXCEPT if all of the points are from question 1   No results found for any visits on 07/02/21.  Assessment & Plan    Routine Health Maintenance and Physical Exam  Exercise Activities and Dietary recommendations --balanced diet high in fiber and protein, low in sugars, carbs, fats. --physical activity/exercise 30 minutes 3-5 times a week     There is no immunization history on file for this patient.  Health Maintenance  Topic Date Due   PAP SMEAR-Modifier  12/27/2020   COVID-19 Vaccine (1) 07/18/2021 (Originally 11/22/1985)   TETANUS/TDAP  07/03/2022 (Originally 05/22/2004)   INFLUENZA VACCINE  08/12/2021   Hepatitis C Screening  Completed   HIV Screening  Completed   HPV VACCINES  Aged Out    Discussed health benefits of physical activity, and encouraged her to engage in regular exercise appropriate for her age and condition.  Problem List Items Addressed This Visit       Cardiovascular and Mediastinum   Hypertension    Elevated in office originally but came down. Suggested we may not need medication, pt would prefer as she is concerned over occasional elevations Advised restarting just lisinopril 10 mg and checking bp at home. Potential to d/c if pressures drop. Will check cmp F/u 6 weeks      Relevant Medications   lisinopril (ZESTRIL) 10 MG tablet   Other Relevant Orders   CBC w/Diff/Platelet   Comprehensive Metabolic Panel (CMET)     Other   Elevated liver enzymes     Historically will  Recheck cmp      Relevant Orders   Comprehensive Metabolic Panel (CMET)   Moderate mixed hyperlipidemia not requiring statin therapy    historicaly will check fasting lipids      Relevant Medications   lisinopril (ZESTRIL) 10 MG tablet   Other Relevant Orders   Comprehensive Metabolic Panel (CMET)   Lipid Profile   Weight loss    Currently using mounjaro. D/t significant weight loss in short period of time, will check tsh/t4 to ensure continues to be normal on medication      Relevant Orders   TSH + free T4   Other Visit Diagnoses     Annual physical exam    -  Primary   Relevant Orders   HgB A1c   Cervical cancer screening       Relevant Orders   Cytology - PAP        Return in about 6 weeks (around 08/13/2021) for hypertension.     I, [name], PA-C have reviewed all documentation for this visit. The documentation on  07/02/2021  for the exam, diagnosis, procedures, and orders are all accurate and complete.  [name], PA-C [facility] [address] [place], [place], [address] Office: [phone] Fax: [phone]   [facility]

## 2021-07-02 NOTE — Assessment & Plan Note (Signed)
Historically will  Recheck cmp

## 2021-07-02 NOTE — Assessment & Plan Note (Addendum)
Elevated in office originally but came down. Suggested we may not need medication, pt would prefer as she is concerned over occasional elevations Advised restarting just lisinopril 10 mg and checking bp at home. Potential to d/c if pressures drop. Will check cmp F/u 6 weeks

## 2021-07-10 LAB — CYTOLOGY - PAP: Adequacy: ABNORMAL

## 2021-07-11 ENCOUNTER — Encounter: Payer: Self-pay | Admitting: Physician Assistant

## 2021-08-13 ENCOUNTER — Ambulatory Visit: Payer: BC Managed Care – PPO | Admitting: Physician Assistant

## 2021-08-19 DIAGNOSIS — M4316 Spondylolisthesis, lumbar region: Secondary | ICD-10-CM | POA: Diagnosis not present

## 2021-08-19 DIAGNOSIS — M542 Cervicalgia: Secondary | ICD-10-CM | POA: Diagnosis not present

## 2021-08-19 DIAGNOSIS — Z6831 Body mass index (BMI) 31.0-31.9, adult: Secondary | ICD-10-CM | POA: Diagnosis not present

## 2021-09-09 DIAGNOSIS — M5451 Vertebrogenic low back pain: Secondary | ICD-10-CM | POA: Diagnosis not present

## 2021-09-09 DIAGNOSIS — M542 Cervicalgia: Secondary | ICD-10-CM | POA: Diagnosis not present

## 2021-09-22 ENCOUNTER — Ambulatory Visit (INDEPENDENT_AMBULATORY_CARE_PROVIDER_SITE_OTHER): Payer: BC Managed Care – PPO | Admitting: Physician Assistant

## 2021-09-22 ENCOUNTER — Other Ambulatory Visit (HOSPITAL_COMMUNITY)
Admission: RE | Admit: 2021-09-22 | Discharge: 2021-09-22 | Disposition: A | Discharge status: Home / Self Care | Payer: BC Managed Care – PPO | Source: Ambulatory Visit | Attending: Physician Assistant | Admitting: Physician Assistant

## 2021-09-22 VITALS — BP 142/88 | HR 76 | Wt 179.3 lb

## 2021-09-22 DIAGNOSIS — Z124 Encounter for screening for malignant neoplasm of cervix: Secondary | ICD-10-CM

## 2021-09-22 DIAGNOSIS — E669 Obesity, unspecified: Secondary | ICD-10-CM | POA: Diagnosis not present

## 2021-09-22 NOTE — Progress Notes (Unsigned)
     I,Allison Valdez,acting as a Neurosurgeon for [employer], PA-C.,have documented all relevant documentation on the behalf of Allison Ferguson, PA-C,as directed by  Allison Ferguson, PA-C while in the presence of Allison Ferguson, PA-C.   Established patient visit   Patient: Allison Valdez   DOB: 10-May-1985   36 y.o. Female  MRN: [id number] Visit Date: 09/22/2021  Today's healthcare provider: [name], PA-C   Cc. Repeat pap, weight managemnet  Subjective    HPI  *** Pt had a non-diagnostic pap smear and presents today for a repeat.    Weight loss discussion  Medications: Outpatient Medications Prior to Visit  Medication Sig   levonorgestrel (MIRENA) 20 MCG/24HR IUD 1 each by Intrauterine route once.   lisinopril (ZESTRIL) 10 MG tablet Take 1 tablet (10 mg total) by mouth daily.   methylphenidate (METADATE ER) 20 MG ER tablet Take 40 mg by mouth daily.   MOUNJARO 12.5 MG/0.5ML Pen Inject into the skin once a week.   traZODone (DESYREL) 50 MG tablet Take 25-50 mg by mouth at bedtime as needed.   No facility-administered medications prior to visit.    Review of Systems  {Labs  Heme  Chem  Endocrine  Serology  Results Review (optional):23779}   Objective    There were no vitals taken for this visit. {Show previous vital signs (optional):23777}  Physical Exam  ***  No results found for any visits on 09/22/21.  Assessment & Plan     ***  No follow-ups on file.      {provider attestation***:1}   Allison Ferguson, PA-C  [facility] [phone] (phone) [phone] (fax)  [facility]

## 2021-09-23 ENCOUNTER — Encounter: Payer: Self-pay | Admitting: Physician Assistant

## 2021-09-23 DIAGNOSIS — E669 Obesity, unspecified: Secondary | ICD-10-CM | POA: Insufficient documentation

## 2021-09-23 NOTE — Patient Instructions (Signed)
[email or web address]  Virtual therapy  

## 2021-09-23 NOTE — Assessment & Plan Note (Signed)
Pt reports 70 + lb weight loss on mounjaro. Currently rationing her supply, taking every 2 weeks.. Discussed what d/c will change with physical and mental symptoms surrounding food/metabolism. Suggested therapy in regards to pt identified 'food noise'. Gave virtual options.  Discussed alternative of contrave-- her insurance may cover -- BMI > 30, comorbidities of HTN and HLD Explained MOA, SE, CI.  She will consider

## 2021-09-29 LAB — CYTOLOGY - PAP
Comment: NEGATIVE
Diagnosis: NEGATIVE
Diagnosis: REACTIVE
High risk HPV: NEGATIVE

## 2021-09-30 ENCOUNTER — Other Ambulatory Visit: Payer: Self-pay | Admitting: Physician Assistant

## 2021-09-30 ENCOUNTER — Encounter: Payer: Self-pay | Admitting: Physician Assistant

## 2021-09-30 DIAGNOSIS — B9689 Other specified bacterial agents as the cause of diseases classified elsewhere: Secondary | ICD-10-CM

## 2021-09-30 MED ORDER — METRONIDAZOLE 500 MG PO TABS: 500.0000 mg | ORAL_TABLET | Freq: Two times a day (BID) | ORAL | 0 refills | Status: AC

## 2021-10-29 ENCOUNTER — Telehealth: Payer: BC Managed Care – PPO | Admitting: Nurse Practitioner

## 2021-10-29 DIAGNOSIS — B3731 Acute candidiasis of vulva and vagina: Secondary | ICD-10-CM | POA: Diagnosis not present

## 2021-10-29 MED ORDER — FLUCONAZOLE 150 MG PO TABS: 150.0000 mg | ORAL_TABLET | Freq: Once | ORAL | 0 refills | Status: AC

## 2021-10-29 NOTE — Progress Notes (Signed)

## 2021-11-11 ENCOUNTER — Telehealth: Payer: BC Managed Care – PPO | Admitting: Physician Assistant

## 2021-11-11 DIAGNOSIS — J019 Acute sinusitis, unspecified: Secondary | ICD-10-CM | POA: Diagnosis not present

## 2021-11-11 DIAGNOSIS — B9689 Other specified bacterial agents as the cause of diseases classified elsewhere: Secondary | ICD-10-CM

## 2021-11-11 MED ORDER — FLUCONAZOLE 150 MG PO TABS: ORAL_TABLET | ORAL | 0 refills | Status: DC

## 2021-11-11 MED ORDER — AMOXICILLIN-POT CLAVULANATE 875-125 MG PO TABS: 1.0000 | ORAL_TABLET | Freq: Two times a day (BID) | ORAL | 0 refills | Status: DC

## 2021-11-11 NOTE — Progress Notes (Signed)
I have spent 5 minutes in review of e-visit questionnaire, review and updating patient chart, medical decision making and response to patient.   Sundai Probert Cody Adriano Bischof, PA-C    

## 2021-11-11 NOTE — Progress Notes (Signed)
E-Visit for Sinus Problems  We are sorry that you are not feeling well.  Here is how we plan to help!  Based on what you have shared with me it looks like you have sinusitis.  Sinusitis is inflammation and infection in the sinus cavities of the head.  Based on your presentation I believe you most likely have Acute Bacterial Sinusitis.  This is an infection caused by bacteria and is treated with antibiotics. I have prescribed Augmentin 875mg /125mg  one tablet twice daily with food, for 7 days. You may use an oral decongestant such as Mucinex D or if you have glaucoma or high blood pressure use plain Mucinex. Saline nasal spray help and can safely be used as often as needed for congestion.  If you develop worsening sinus pain, fever or notice severe headache and vision changes, or if symptoms are not better after completion of antibiotic, please schedule an appointment with a health care provider.    I have sent in diflucan in case of yeast from antibiotic use.   Sinus infections are not as easily transmitted as other respiratory infection, however we still recommend that you avoid close contact with loved ones, especially the very young and elderly.  Remember to wash your hands thoroughly throughout the day as this is the number one way to prevent the spread of infection!  Home Care: Only take medications as instructed by your medical team. Complete the entire course of an antibiotic. Do not take these medications with alcohol. A steam or ultrasonic humidifier can help congestion.  You can place a towel over your head and breathe in the steam from hot water coming from a faucet. Avoid close contacts especially the very young and the elderly. Cover your mouth when you cough or sneeze. Always remember to wash your hands.  Get Help Right Away If: You develop worsening fever or sinus pain. You develop a severe head ache or visual changes. Your symptoms persist after you have completed your treatment  plan.  Make sure you Understand these instructions. Will watch your condition. Will get help right away if you are not doing well or get worse.  Thank you for choosing an e-visit.  Your e-visit answers were reviewed by a board certified advanced clinical practitioner to complete your personal care plan. Depending upon the condition, your plan could have included both over the counter or prescription medications.  Please review your pharmacy choice. Make sure the pharmacy is open so you can pick up prescription now. If there is a problem, you may contact your provider through [employer] and have the prescription routed to another pharmacy.  Your safety is important to [place]. If you have drug allergies check your prescription carefully.   For the next 24 hours you can use MyChart to ask questions about today's visit, request a non-urgent call back, or ask for a work or school excuse. You will get an email in the next two days asking about your experience. I hope that your e-visit has been valuable and will speed your recovery.

## 2021-12-11 DIAGNOSIS — S39012A Strain of muscle, fascia and tendon of lower back, initial encounter: Secondary | ICD-10-CM | POA: Diagnosis not present

## 2022-02-03 ENCOUNTER — Encounter: Payer: Self-pay | Admitting: Physician Assistant

## 2022-02-03 NOTE — Progress Notes (Deleted)
      I,Sha'taria Debany Vantol,acting as a Education administrator for [employer], PA-C.,have documented all relevant documentation on the behalf of [name], PA-C,as directed by  [name], PA-C while in the presence of [name], PA-C.  Established patient visit   Patient: Allison Valdez   DOB: 11-Jul-1985   37 y.o. Female  MRN: [id number] Visit Date: 02/04/2022  Today's healthcare provider: [name], PA-C   No chief complaint on file.  Subjective    HPI  Follow up for weight management  The patient was last seen for this 4 months ago. Changes made at last visit include ***.  She reports {excellent/good/fair/poor:19665} compliance with treatment. She feels that condition is {improved/worse/unchanged:3041574}. She {is/is not:21021397} having side effects. ***  -----------------------------------------------------------------------------------------   Medications: Outpatient Medications Prior to Visit  Medication Sig   amoxicillin-clavulanate (AUGMENTIN) 875-125 MG tablet Take 1 tablet by mouth 2 (two) times daily.   fluconazole (DIFLUCAN) 150 MG tablet Take 1 tablet PO once. Repeat in 3 days if needed.   levonorgestrel (MIRENA) 20 MCG/24HR IUD 1 each by Intrauterine route once.   lisinopril (ZESTRIL) 10 MG tablet Take 1 tablet (10 mg total) by mouth daily.   MOUNJARO 15 MG/0.5ML Pen Inject into the skin once a week.   traZODone (DESYREL) 50 MG tablet Take 25-50 mg by mouth at bedtime as needed.   No facility-administered medications prior to visit.    Review of Systems  {Labs  Heme  Chem  Endocrine  Serology  Results Review (optional):23779}   Objective    There were no vitals taken for this visit. {Show previous vital signs (optional):23777}  Physical Exam  ***  No results found for any visits on 02/04/22.  Assessment & Plan     ***  No follow-ups on file.      {provider attestation***:1}   [name], PA-C  [place] [phone] (phone) [phone] (fax)  [place]

## 2022-02-04 ENCOUNTER — Ambulatory Visit: Payer: BC Managed Care – PPO | Admitting: Physician Assistant

## 2022-03-10 ENCOUNTER — Encounter: Payer: Self-pay | Admitting: Physician Assistant

## 2022-03-14 ENCOUNTER — Telehealth: Payer: BC Managed Care – PPO | Admitting: Family Medicine

## 2022-03-14 DIAGNOSIS — I10 Essential (primary) hypertension: Secondary | ICD-10-CM

## 2022-03-14 NOTE — Patient Instructions (Signed)
Hypertension, Adult High blood pressure (hypertension) is when the force of blood pumping through the arteries is too strong. The arteries are the blood vessels that carry blood from the heart throughout the body. Hypertension forces the heart to work harder to pump blood and may cause arteries to become narrow or stiff. Untreated or uncontrolled hypertension can lead to a heart attack, heart failure, a stroke, kidney disease, and other problems. A blood pressure reading consists of a higher number over a lower number. Ideally, your blood pressure should be below 120/80. The first ("top") number is called the systolic pressure. It is a measure of the pressure in your arteries as your heart beats. The second ("bottom") number is called the diastolic pressure. It is a measure of the pressure in your arteries as the heart relaxes. What are the causes? The exact cause of this condition is not known. There are some conditions that result in high blood pressure. What increases the risk? Certain factors may make you more likely to develop high blood pressure. Some of these risk factors are under your control, including: Smoking. Not getting enough exercise or physical activity. Being overweight. Having too much fat, sugar, calories, or salt (sodium) in your diet. Drinking too much alcohol. Other risk factors include: Having a personal history of heart disease, diabetes, high cholesterol, or kidney disease. Stress. Having a family history of high blood pressure and high cholesterol. Having obstructive sleep apnea. Age. The risk increases with age. What are the signs or symptoms? High blood pressure may not cause symptoms. Very high blood pressure (hypertensive crisis) may cause: Headache. Fast or irregular heartbeats (palpitations). Shortness of breath. Nosebleed. Nausea and vomiting. Vision changes. Severe chest pain, dizziness, and seizures. How is this diagnosed? This condition is diagnosed by  measuring your blood pressure while you are seated, with your arm resting on a flat surface, your legs uncrossed, and your feet flat on the floor. The cuff of the blood pressure monitor will be placed directly against the skin of your upper arm at the level of your heart. Blood pressure should be measured at least twice using the same arm. Certain conditions can cause a difference in blood pressure between your right and left arms. If you have a high blood pressure reading during one visit or you have normal blood pressure with other risk factors, you may be asked to: Return on a different day to have your blood pressure checked again. Monitor your blood pressure at home for 1 week or longer. If you are diagnosed with hypertension, you may have other blood or imaging tests to help your health care provider understand your overall risk for other conditions. How is this treated? This condition is treated by making healthy lifestyle changes, such as eating healthy foods, exercising more, and reducing your alcohol intake. You may be referred for counseling on a healthy diet and physical activity. Your health care provider may prescribe medicine if lifestyle changes are not enough to get your blood pressure under control and if: Your systolic blood pressure is above 130. Your diastolic blood pressure is above 80. Your personal target blood pressure may vary depending on your medical conditions, your age, and other factors. Follow these instructions at home: Eating and drinking  Eat a diet that is high in fiber and potassium, and low in sodium, added sugar, and fat. An example of this eating plan is called the DASH diet. DASH stands for Dietary Approaches to Stop Hypertension. To eat this way: Eat   plenty of fresh fruits and vegetables. Try to fill one half of your plate at each meal with fruits and vegetables. Eat whole grains, such as whole-wheat pasta, brown rice, or whole-grain bread. Fill about one  fourth of your plate with whole grains. Eat or drink low-fat dairy products, such as skim milk or low-fat yogurt. Avoid fatty cuts of meat, processed or cured meats, and poultry with skin. Fill about one fourth of your plate with lean proteins, such as fish, chicken without skin, beans, eggs, or tofu. Avoid pre-made and processed foods. These tend to be higher in sodium, added sugar, and fat. Reduce your daily sodium intake. Many people with hypertension should eat less than 1,500 mg of sodium a day. Do not drink alcohol if: Your health care provider tells you not to drink. You are pregnant, may be pregnant, or are planning to become pregnant. If you drink alcohol: Limit how much you have to: 0-1 drink a day for women. 0-2 drinks a day for men. Know how much alcohol is in your drink. In the [place] one drink equals one 12 oz bottle of beer (355 mL), one 5 oz glass of wine (148 mL), or one 1 oz glass of hard liquor (44 mL). Lifestyle  Work with your health care provider to maintain a healthy body weight or to lose weight. Ask what an ideal weight is for you. Get at least 30 minutes of exercise that causes your heart to beat faster (aerobic exercise) most days of the week. Activities may include walking, swimming, or biking. Include exercise to strengthen your muscles (resistance exercise), such as Pilates or lifting weights, as part of your weekly exercise routine. Try to do these types of exercises for 30 minutes at least 3 days a week. Do not use any products that contain nicotine or tobacco. These products include cigarettes, chewing tobacco, and vaping devices, such as e-cigarettes. If you need help quitting, ask your health care provider. Monitor your blood pressure at home as told by your health care provider. Keep all follow-up visits. This is important. Medicines Take over-the-counter and prescription medicines only as told by your health care provider. Follow directions carefully. Blood  pressure medicines must be taken as prescribed. Do not skip doses of blood pressure medicine. Doing this puts you at risk for problems and can make the medicine less effective. Ask your health care provider about side effects or reactions to medicines that you should watch for. Contact a health care provider if you: Think you are having a reaction to a medicine you are taking. Have headaches that keep coming back (recurring). Feel dizzy. Have swelling in your ankles. Have trouble with your vision. Get help right away if you: Develop a severe headache or confusion. Have unusual weakness or numbness. Feel faint. Have severe pain in your chest or abdomen. Vomit repeatedly. Have trouble breathing. These symptoms may be an emergency. Get help right away. Call 911. Do not wait to see if the symptoms will go away. Do not drive yourself to the hospital. Summary Hypertension is when the force of blood pumping through your arteries is too strong. If this condition is not controlled, it may put you at risk for serious complications. Your personal target blood pressure may vary depending on your medical conditions, your age, and other factors. For most people, a normal blood pressure is less than 120/80. Hypertension is treated with lifestyle changes, medicines, or a combination of both. Lifestyle changes include losing weight, eating a healthy,   low-sodium diet, exercising more, and limiting alcohol. This information is not intended to replace advice given to you by your health care provider. Make sure you discuss any questions you have with your health care provider. Document Revised: 11/05/2020 Document Reviewed: 11/05/2020 Elsevier Patient Education  2023 Elsevier Inc.  

## 2022-03-14 NOTE — Progress Notes (Signed)
Virtual Visit Consent   Allison Valdez, you are scheduled for a virtual visit with a [facility] provider today. Just as with appointments in the office, your consent must be obtained to participate. Your consent will be active for this visit and any virtual visit you may have with one of our providers in the next 365 days. If you have a MyChart account, a copy of this consent can be sent to you electronically.  As this is a virtual visit, video technology does not allow for your provider to perform a traditional examination. This may limit your provider's ability to fully assess your condition. If your provider identifies any concerns that need to be evaluated in person or the need to arrange testing (such as labs, EKG, etc.), we will make arrangements to do so. Although advances in technology are sophisticated, we cannot ensure that it will always work on either your end or our end. If the connection with a video visit is poor, the visit may have to be switched to a telephone visit. With either a video or telephone visit, we are not always able to ensure that we have a secure connection.  By engaging in this virtual visit, you consent to the provision of healthcare and authorize for your insurance to be billed (if applicable) for the services provided during this visit. Depending on your insurance coverage, you may receive a charge related to this service.  I need to obtain your verbal consent now. Are you willing to proceed with your visit today? Allison Valdez has provided verbal consent on 03/14/2022 for a virtual visit (video or telephone). Dellia Nims, FNP  Date: 03/14/2022 8:34 AM  Virtual Visit via Video Note   I, Dellia Nims, connected with  Allison Valdez  ([id number], Nov 05, 1985) on 03/14/22 at  8:30 AM EST by a video-enabled telemedicine application and verified that I am speaking with the correct person using two identifiers.  Location: Patient: Virtual Visit Location  Patient: Home Provider: Virtual Visit Location Provider: Home Office   I discussed the limitations of evaluation and management by telemedicine and the availability of in person appointments. The patient expressed understanding and agreed to proceed.    History of Present Illness: Allison Valdez is a 37 y.o. who identifies as a female who was assigned female at birth, and is being seen today for severe HTN with chest tightness at times. No URI sx. History of HTN with recent weight gain of 35 lbs. She was able to come off BP meds but feels she needs to go back on them. BP 184/124. She also has headaches. Marland Kitchen  HPI: HPI  Problems:  Patient Active Problem List   Diagnosis Date Noted   Obesity (BMI 30-39.9) 09/23/2021   Elevated liver enzymes 07/02/2021   Moderate mixed hyperlipidemia not requiring statin therapy 07/02/2021   Weight loss 07/02/2021   Hypertension    Nonallopathic lesion of sacral region 08/19/2018   Nonallopathic lesion of lumbosacral region 08/19/2018   Nonallopathic lesion of thoracic region 08/19/2018   Greater trochanteric bursitis of both hips 09/21/2017   Pars defect of lumbar spine 09/21/2017   Low back pain 08/09/2017   Adjustment disorder with mixed anxiety and depressed mood 03/16/2016   Anxiety 03/16/2016   Abnormal weight gain 03/16/2016   Chronic infection of sinus 03/07/2007   Psoriasis 10/05/2005    Allergies: No Known Allergies Medications:  Current Outpatient Medications:    amoxicillin-clavulanate (AUGMENTIN) 875-125 MG tablet, Take  1 tablet by mouth 2 (two) times daily., Disp: 14 tablet, Rfl: 0   fluconazole (DIFLUCAN) 150 MG tablet, Take 1 tablet PO once. Repeat in 3 days if needed., Disp: 2 tablet, Rfl: 0   levonorgestrel (MIRENA) 20 MCG/24HR IUD, 1 each by Intrauterine route once., Disp: , Rfl:    lisinopril (ZESTRIL) 10 MG tablet, Take 1 tablet (10 mg total) by mouth daily., Disp: 90 tablet, Rfl: 1   MOUNJARO 15 MG/0.5ML Pen, Inject into the  skin once a week., Disp: , Rfl:    traZODone (DESYREL) 50 MG tablet, Take 25-50 mg by mouth at bedtime as needed., Disp: , Rfl:   Observations/Objective: Patient is well-developed, well-nourished in no acute distress.  Resting comfortably  at home.  Head is normocephalic, atraumatic.  No labored breathing.  Speech is clear and coherent with logical content.  Patient is alert and oriented at baseline.    Assessment and Plan: 1. Hypertension, unspecified type  Discussed chest tighness and severity of HTN, advised to go directly to urgent care.   Follow Up Instructions: I discussed the assessment and treatment plan with the patient. The patient was provided an opportunity to ask questions and all were answered. The patient agreed with the plan and demonstrated an understanding of the instructions.  A copy of instructions were sent to the patient via MyChart unless otherwise noted below.     The patient was advised to call back or seek an in-person evaluation if the symptoms worsen or if the condition fails to improve as anticipated.  Time:  I spent 8 minutes with the patient via telehealth technology discussing the above problems/concerns.    Dellia Nims, FNP  NEW!! [facility] [place] [facility] [place] Get Driving Directions [id number] [address] [place], [place] [place] [facility] [place] Get Driving Directions [id number] [place], [place] [address]   [facility]) Get Driving Directions [id number] [address], [place] [address]  [facility] [place] ([place]) Get Driving Directions [id number] [address] [place],  [place]  [address]  [facility] - at [place] Get Driving Directions  [id number] [place],  [place]  [address]   [facility] Get Driving Directions [id number] [address] [place], [place], [place] [address]   [facility] Get Driving Directions  [id number] [address], [place] [address]   [facility] [place] Get Driving Directions [id number] [address] [place], [place] [address]

## 2022-06-19 ENCOUNTER — Ambulatory Visit: Payer: BC Managed Care – PPO | Admitting: Physician Assistant

## 2022-06-19 NOTE — Progress Notes (Deleted)
    I,Theo Reither,acting as a Neurosurgeon for [employer], PA-C.,have documented all relevant documentation on the behalf of [name], PA-C,as directed by  [name], PA-C while in the presence of [name], PA-C.    Established patient visit   Patient: Allison Valdez   DOB: 05/05/1985   37 y.o. Female  MRN: [id number] Visit Date: 06/19/2022  Today's healthcare provider: [name], PA-C   No chief complaint on file.  Subjective    HPI  ***  Medications: Outpatient Medications Prior to Visit  Medication Sig   amoxicillin-clavulanate (AUGMENTIN) 875-125 MG tablet Take 1 tablet by mouth 2 (two) times daily.   fluconazole (DIFLUCAN) 150 MG tablet Take 1 tablet PO once. Repeat in 3 days if needed.   levonorgestrel (MIRENA) 20 MCG/24HR IUD 1 each by Intrauterine route once.   lisinopril (ZESTRIL) 10 MG tablet Take 1 tablet (10 mg total) by mouth daily.   MOUNJARO 15 MG/0.5ML Pen Inject into the skin once a week.   traZODone (DESYREL) 50 MG tablet Take 25-50 mg by mouth at bedtime as needed.   No facility-administered medications prior to visit.    Review of Systems  {Labs  Heme  Chem  Endocrine  Serology  Results Review (optional):23779}   Objective    There were no vitals taken for this visit. {Show previous [place] signs (optional):23777}  Physical Exam  ***  No results found for any visits on 06/19/22.  Assessment & Plan     ***  No follow-ups on file.      {provider attestation***:1}   [name], PA-C  [facility] [phone] (phone) [phone] (fax)  [facility]

## 2022-07-05 DIAGNOSIS — R519 Headache, unspecified: Secondary | ICD-10-CM | POA: Diagnosis not present

## 2022-07-05 DIAGNOSIS — R059 Cough, unspecified: Secondary | ICD-10-CM | POA: Diagnosis not present

## 2022-07-05 DIAGNOSIS — U071 COVID-19: Secondary | ICD-10-CM | POA: Diagnosis not present

## 2022-10-30 DIAGNOSIS — F411 Generalized anxiety disorder: Secondary | ICD-10-CM | POA: Diagnosis not present

## 2022-10-30 DIAGNOSIS — F900 Attention-deficit hyperactivity disorder, predominantly inattentive type: Secondary | ICD-10-CM | POA: Diagnosis not present

## 2022-11-17 ENCOUNTER — Encounter: Payer: Self-pay | Admitting: Physician Assistant

## 2022-11-17 ENCOUNTER — Ambulatory Visit (INDEPENDENT_AMBULATORY_CARE_PROVIDER_SITE_OTHER): Payer: BC Managed Care – PPO | Admitting: Physician Assistant

## 2022-11-17 VITALS — BP 137/84 | HR 71 | Ht 64.0 in | Wt 237.0 lb

## 2022-11-17 DIAGNOSIS — E782 Mixed hyperlipidemia: Secondary | ICD-10-CM

## 2022-11-17 DIAGNOSIS — Z0000 Encounter for general adult medical examination without abnormal findings: Secondary | ICD-10-CM

## 2022-11-17 DIAGNOSIS — I10 Essential (primary) hypertension: Secondary | ICD-10-CM | POA: Diagnosis not present

## 2022-11-17 DIAGNOSIS — E559 Vitamin D deficiency, unspecified: Secondary | ICD-10-CM | POA: Diagnosis not present

## 2022-11-17 DIAGNOSIS — R635 Abnormal weight gain: Secondary | ICD-10-CM | POA: Diagnosis not present

## 2022-11-17 MED ORDER — LISINOPRIL 10 MG PO TABS: 10.0000 mg | ORAL_TABLET | Freq: Every day | ORAL | 3 refills | Status: DC

## 2022-11-17 NOTE — Progress Notes (Signed)
Complete physical exam   Patient: Rennae Valdez   DOB: [birth year]   37 y.o. Female  MRN: [id number] Visit Date: 11/17/2022  Today's healthcare provider: [name], PA-C   Cc. cpe  Subjective    Allison Valdez is a 37 y.o. female who presents today for a complete physical exam.   Pt reports a significant weight gain, 2/2 losing coverage for anti obesity meds, and having to stop w/ Zepbound dosing at 15 mg.  She is starting back with diet and exercise.  Past Medical History:  Diagnosis Date   Anxiety    Depression    Hypertension    Obesity    Vitamin D deficiency disease    Past Surgical History:  Procedure Laterality Date   OVARIAN CYST REMOVAL Right    SPINE SURGERY     2/23 lumbar fusion   Social History   Socioeconomic History   Marital status: Married    Spouse name: Not on file   Number of children: Not on file   Years of education: Not on file   Highest education level: Not on file  Occupational History   Not on file  Tobacco Use   Smoking status: Never   Smokeless tobacco: Never  Vaping Use   Vaping status: Never Used  Substance and Sexual Activity   Alcohol use: Yes    Comment: occas   Drug use: No   Sexual activity: Yes    Birth control/protection: I.U.D.    Comment: Mirena  Other Topics Concern   Not on file  Social History Narrative   Not on file   Social Determinants of Health   Financial Resource Strain: Not on file  Food Insecurity: Not on file  Transportation Needs: Not on file  Physical Activity: Not on file  Stress: Not on file  Social Connections: Not on file  Intimate Partner Violence: Not on file   Family Status  Relation Name Status   Mother  Alive   Father  Deceased  No partnership data on file   Family History  Problem Relation Age of Onset   Diabetes Mother    Diabetes Father    No Known Allergies  Patient Care Team: [name], PA-C as PCP - General (Physician Assistant)    Medications: Outpatient Medications Prior to Visit  Medication Sig   levonorgestrel (MIRENA) 20 MCG/24HR IUD 1 each by Intrauterine route once.   traZODone (DESYREL) 50 MG tablet Take 25-50 mg by mouth at bedtime as needed.   [DISCONTINUED] amoxicillin-clavulanate (AUGMENTIN) 875-125 MG tablet Take 1 tablet by mouth 2 (two) times daily.   [DISCONTINUED] fluconazole (DIFLUCAN) 150 MG tablet Take 1 tablet PO once. Repeat in 3 days if needed.   [DISCONTINUED] lisinopril (ZESTRIL) 10 MG tablet Take 1 tablet (10 mg total) by mouth daily.   [DISCONTINUED] MOUNJARO 15 MG/0.5ML Pen Inject into the skin once a week.   No facility-administered medications prior to visit.    Review of Systems  Constitutional:  Negative for fatigue and fever.  Respiratory:  Negative for cough and shortness of breath.   Cardiovascular:  Negative for chest pain and leg swelling.  Gastrointestinal:  Negative for abdominal pain.  Neurological:  Negative for dizziness and headaches.      Objective    BP 137/84   Pulse 71   Ht 5\' 4"  (1.626 m)   Wt 237 lb (107.5 kg)   SpO2 98%   BMI 40.68 kg/m    Physical Exam  Constitutional:      General: She is awake.     Appearance: She is well-developed. She is not ill-appearing.  HENT:     Head: Normocephalic.     Right Ear: Tympanic membrane normal.     Left Ear: Tympanic membrane normal.     Nose: Nose normal. No congestion or rhinorrhea.     Mouth/Throat:     Pharynx: No oropharyngeal exudate or posterior oropharyngeal erythema.  Eyes:     Conjunctiva/sclera: Conjunctivae normal.     Pupils: Pupils are equal, round, and reactive to light.  Neck:     Thyroid: No thyroid mass or thyromegaly.  Cardiovascular:     Rate and Rhythm: Normal rate and regular rhythm.     Heart sounds: Normal heart sounds.  Pulmonary:     Effort: Pulmonary effort is normal.     Breath sounds: Normal breath sounds.  Abdominal:     Palpations: Abdomen is soft.     Tenderness:  There is no abdominal tenderness.  Musculoskeletal:     Right lower leg: No swelling. No edema.     Left lower leg: No swelling. No edema.  Lymphadenopathy:     Cervical: No cervical adenopathy.  Skin:    General: Skin is warm.  Neurological:     Mental Status: She is alert and oriented to person, place, and time.  Psychiatric:        Attention and Perception: Attention normal.        Mood and Affect: Mood normal.        Speech: Speech normal.        Behavior: Behavior normal. Behavior is cooperative.     Last depression screening scores    11/17/2022    9:13 AM 09/22/2021    3:06 PM 07/02/2021   11:09 AM  PHQ 2/9 Scores  PHQ - 2 Score 0 0 0  PHQ- 9 Score  0 0   Last fall risk screening    11/17/2022    9:13 AM  Fall Risk   Falls in the past year? 0  Follow up Falls evaluation completed   Last Audit-C alcohol use screening    09/22/2021    3:06 PM  Alcohol Use Disorder Test (AUDIT)  1. How often do you have a drink containing alcohol? 3  2. How many drinks containing alcohol do you have on a typical day when you are drinking? 1  3. How often do you have six or more drinks on one occasion? 2  AUDIT-C Score 6   A score of 3 or more in women, and 4 or more in men indicates increased risk for alcohol abuse, EXCEPT if all of the points are from question 1   No results found for any visits on 11/17/22.  Assessment & Plan    Routine Health Maintenance and Physical Exam  Exercise Activities and Dietary recommendations   --balanced diet high in fiber and protein, low in sugars, carbs, fats. --physical activity/exercise 20-30 minutes 3-5 times a week     There is no immunization history on file for this patient.  Health Maintenance  Topic Date Due   COVID-19 Vaccine (1 - 2023-24 season) 12/03/2022 (Originally 09/13/2022)   INFLUENZA VACCINE  04/12/2023 (Originally 08/13/2022)   DTaP/Tdap/Td (1 - Tdap) 11/17/2023 (Originally 05/22/2004)   Cervical Cancer Screening  (HPV/Pap Cotest)  09/23/2026   Hepatitis C Screening  Completed   HIV Screening  Completed   HPV VACCINES  Aged Out  Discussed health benefits of physical activity, and encouraged her to engage in regular exercise appropriate for her age and condition.  Problem List Items Addressed This Visit       Cardiovascular and Mediastinum   Hypertension    Chronic, moderately controlled Continue lisinopril 10 mg  Consider increasing dose to 20 mg, pt will monitor pressure at home and report back if consistently >140/90.  Cmp ordered F/u 6 mo      Relevant Medications   lisinopril (ZESTRIL) 10 MG tablet     Other   Weight gain    2/2 to stopping zepbound at 15 mg (insurance coverage) without being able to titrate down, and lifestyle.  Pt is trying to stick to balanced diet and exerise      Relevant Orders   HgB A1c   TSH   T4, free   Moderate mixed hyperlipidemia not requiring statin therapy    Repeat fasting lipids      Relevant Medications   lisinopril (ZESTRIL) 10 MG tablet   Other Visit Diagnoses     Annual physical exam    -  Primary   Relevant Orders   CBC w/Diff   Comp Met (CMET)   Lipid panel   HgB A1c   Vitamin D deficiency       Relevant Orders   Vitamin D (25 hydroxy)      Pt declines flu vaccine, tetanus today.  Return in about 6 months (around 05/17/2023) for hypertension.     [name], PA-C  [facility] [phone] (phone) [phone] (fax)  [facility]

## 2022-11-17 NOTE — Assessment & Plan Note (Signed)
Repeat fasting lipids.

## 2022-11-17 NOTE — Assessment & Plan Note (Signed)
2/2 to stopping zepbound at 15 mg (insurance coverage) without being able to titrate down, and lifestyle.  Pt is trying to stick to balanced diet and exerise

## 2022-11-17 NOTE — Assessment & Plan Note (Signed)
Chronic, moderately controlled Continue lisinopril 10 mg  Consider increasing dose to 20 mg, pt will monitor pressure at home and report back if consistently >140/90.  Cmp ordered F/u 6 mo

## 2022-11-18 DIAGNOSIS — R635 Abnormal weight gain: Secondary | ICD-10-CM | POA: Diagnosis not present

## 2022-11-18 DIAGNOSIS — Z0000 Encounter for general adult medical examination without abnormal findings: Secondary | ICD-10-CM | POA: Diagnosis not present

## 2022-11-18 DIAGNOSIS — E559 Vitamin D deficiency, unspecified: Secondary | ICD-10-CM | POA: Diagnosis not present

## 2022-11-19 ENCOUNTER — Other Ambulatory Visit: Payer: Self-pay | Admitting: Physician Assistant

## 2022-11-19 DIAGNOSIS — E559 Vitamin D deficiency, unspecified: Secondary | ICD-10-CM

## 2022-11-19 LAB — COMPREHENSIVE METABOLIC PANEL WITH GFR
ALT: 24 IU/L (ref 0–32)
AST: 27 IU/L (ref 0–40)
Albumin: 4.5 g/dL (ref 3.9–4.9)
Alkaline Phosphatase: 46 IU/L (ref 44–121)
BUN/Creatinine Ratio: 16 (ref 9–23)
BUN: 13 mg/dL (ref 6–20)
Bilirubin Total: 0.6 mg/dL (ref 0.0–1.2)
CO2: 22 mmol/L (ref 20–29)
Calcium: 9.1 mg/dL (ref 8.7–10.2)
Chloride: 101 mmol/L (ref 96–106)
Creatinine, Ser: 0.8 mg/dL (ref 0.57–1.00)
Globulin, Total: 2.4 g/dL (ref 1.5–4.5)
Glucose: 96 mg/dL (ref 70–99)
Potassium: 4.2 mmol/L (ref 3.5–5.2)
Sodium: 140 mmol/L (ref 134–144)
Total Protein: 6.9 g/dL (ref 6.0–8.5)
eGFR: 97 mL/min/1.73

## 2022-11-19 LAB — CBC WITH DIFFERENTIAL/PLATELET
Basophils Absolute: 0.1 10*3/uL (ref 0.0–0.2)
Basos: 1 %
EOS (ABSOLUTE): 0 10*3/uL (ref 0.0–0.4)
Eos: 1 %
Hematocrit: 41.2 % (ref 34.0–46.6)
Hemoglobin: 14.1 g/dL (ref 11.1–15.9)
Immature Grans (Abs): 0 10*3/uL (ref 0.0–0.1)
Immature Granulocytes: 1 %
Lymphocytes Absolute: 1.7 10*3/uL (ref 0.7–3.1)
Lymphs: 33 %
MCH: 32.3 pg (ref 26.6–33.0)
MCHC: 34.2 g/dL (ref 31.5–35.7)
MCV: 94 fL (ref 79–97)
Monocytes Absolute: 0.5 10*3/uL (ref 0.1–0.9)
Monocytes: 10 %
Neutrophils Absolute: 2.8 10*3/uL (ref 1.4–7.0)
Neutrophils: 54 %
Platelets: 233 10*3/uL (ref 150–450)
RBC: 4.37 x10E6/uL (ref 3.77–5.28)
RDW: 12.5 % (ref 11.7–15.4)
WBC: 5 10*3/uL (ref 3.4–10.8)

## 2022-11-19 LAB — HEMOGLOBIN A1C
Est. average glucose Bld gHb Est-mCnc: 105 mg/dL
Hgb A1c MFr Bld: 5.3 % (ref 4.8–5.6)

## 2022-11-19 LAB — TSH: TSH: 1.71 u[IU]/mL (ref 0.450–4.500)

## 2022-11-19 LAB — LIPID PANEL
Chol/HDL Ratio: 3 ratio (ref 0.0–4.4)
Cholesterol, Total: 205 mg/dL — ABNORMAL HIGH (ref 100–199)
HDL: 68 mg/dL (ref >39)
LDL Chol Calc (NIH): 115 mg/dL — ABNORMAL HIGH (ref 0–99)
Triglycerides: 128 mg/dL (ref 0–149)
VLDL Cholesterol Cal: 22 mg/dL (ref 5–40)

## 2022-11-19 LAB — T4, FREE: Free T4: 0.97 ng/dL (ref 0.82–1.77)

## 2022-11-19 LAB — VITAMIN D 25 HYDROXY (VIT D DEFICIENCY, FRACTURES): Vit D, 25-Hydroxy: 18.1 ng/mL — ABNORMAL LOW (ref 30.0–100.0)

## 2022-11-19 MED ORDER — VITAMIN D (ERGOCALCIFEROL) 1.25 MG (50000 UNIT) PO CAPS: 50000.0000 [IU] | ORAL_CAPSULE | ORAL | 0 refills | Status: DC
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
## 2022-12-28 ENCOUNTER — Encounter: Payer: Self-pay | Admitting: Physician Assistant

## 2022-12-28 ENCOUNTER — Telehealth: Payer: BC Managed Care – PPO | Admitting: Family Medicine

## 2022-12-28 DIAGNOSIS — J069 Acute upper respiratory infection, unspecified: Secondary | ICD-10-CM

## 2022-12-28 MED ORDER — PROMETHAZINE-DM 6.25-15 MG/5ML PO SYRP: 5.0000 mL | ORAL_SOLUTION | Freq: Four times a day (QID) | ORAL | 0 refills | Status: DC | PRN

## 2022-12-28 MED ORDER — FLUTICASONE PROPIONATE 50 MCG/ACT NA SUSP: 2.0000 | Freq: Every day | NASAL | 0 refills | Status: DC

## 2022-12-28 MED ORDER — BENZONATATE 100 MG PO CAPS: 100.0000 mg | ORAL_CAPSULE | Freq: Three times a day (TID) | ORAL | 0 refills | Status: DC | PRN

## 2022-12-28 NOTE — Progress Notes (Signed)

## 2023-02-08 ENCOUNTER — Other Ambulatory Visit: Payer: Self-pay | Admitting: Physician Assistant

## 2023-02-08 DIAGNOSIS — E559 Vitamin D deficiency, unspecified: Secondary | ICD-10-CM

## 2023-04-20 DIAGNOSIS — Z6838 Body mass index (BMI) 38.0-38.9, adult: Secondary | ICD-10-CM | POA: Diagnosis not present

## 2023-04-20 DIAGNOSIS — M4316 Spondylolisthesis, lumbar region: Secondary | ICD-10-CM | POA: Diagnosis not present

## 2023-06-28 ENCOUNTER — Encounter: Payer: Self-pay | Admitting: Physician Assistant

## 2023-07-09 ENCOUNTER — Ambulatory Visit (INDEPENDENT_AMBULATORY_CARE_PROVIDER_SITE_OTHER): Admitting: Physician Assistant

## 2023-07-09 VITALS — BP 180/122 | HR 85 | Ht 64.0 in | Wt 210.4 lb

## 2023-07-09 DIAGNOSIS — F419 Anxiety disorder, unspecified: Secondary | ICD-10-CM | POA: Diagnosis not present

## 2023-07-09 DIAGNOSIS — G4700 Insomnia, unspecified: Secondary | ICD-10-CM

## 2023-07-09 DIAGNOSIS — I10 Essential (primary) hypertension: Secondary | ICD-10-CM

## 2023-07-09 DIAGNOSIS — E669 Obesity, unspecified: Secondary | ICD-10-CM

## 2023-07-09 MED ORDER — TRAZODONE HCL 50 MG PO TABS: 25.0000 mg | ORAL_TABLET | Freq: Every evening | ORAL | 3 refills | Status: DC | PRN

## 2023-07-09 MED ORDER — ESCITALOPRAM OXALATE 10 MG PO TABS: 10.0000 mg | ORAL_TABLET | Freq: Every day | ORAL | 1 refills | Status: DC

## 2023-07-09 MED ORDER — LISINOPRIL 10 MG PO TABS: 10.0000 mg | ORAL_TABLET | Freq: Every day | ORAL | 3 refills | Status: DC

## 2023-07-09 NOTE — Progress Notes (Unsigned)
      Established patient visit   Patient: Allison Valdez   DOB: [birth year]   38 y.o. Female  MRN: [id number] Visit Date: 07/09/2023  Today's healthcare provider: Manuelita Flatness, PA-C   No chief complaint on file.  Subjective     Compounded zepbound ;  10 mg ;   180/122   Medications: Outpatient Medications Prior to Visit  Medication Sig   benzonatate  (TESSALON ) 100 MG capsule Take 1 capsule (100 mg total) by mouth 3 (three) times daily as needed for cough.   fluticasone  (FLONASE ) 50 MCG/ACT nasal spray Place 2 sprays into both nostrils daily.   levonorgestrel  (MIRENA ) 20 MCG/24HR IUD 1 each by Intrauterine route once.   lisinopril  (ZESTRIL ) 10 MG tablet Take 1 tablet (10 mg total) by mouth daily.   promethazine -dextromethorphan (PROMETHAZINE -DM) 6.25-15 MG/5ML syrup Take 5 mLs by mouth 4 (four) times daily as needed for cough.   traZODone (DESYREL) 50 MG tablet Take 25-50 mg by mouth at bedtime as needed.   Vitamin D , Ergocalciferol , (DRISDOL ) 1.25 MG (50000 UNIT) CAPS capsule TAKE 1 CAPSULE BY MOUTH EVERY 7 DAYS   No facility-administered medications prior to visit.    Review of Systems {Insert previous labs (optional):23779} {See past labs  Heme  Chem  Endocrine  Serology  Results Review (optional):1}   Objective    There were no vitals taken for this visit. {Insert last BP/Wt (optional):23777}{See vitals history (optional):1}  Physical Exam Constitutional:      General: She is awake.     Appearance: She is well-developed.  HENT:     Head: Normocephalic.   Eyes:     Conjunctiva/sclera: Conjunctivae normal.    Cardiovascular:     Rate and Rhythm: Normal rate and regular rhythm.     Heart sounds: Normal heart sounds.  Pulmonary:     Effort: Pulmonary effort is normal.     Breath sounds: Normal breath sounds.   Skin:    General: Skin is warm.   Neurological:     Mental Status: She is alert and oriented to person, place, and time.    Psychiatric:        Attention and Perception: Attention normal.        Mood and Affect: Mood normal.        Speech: Speech normal.        Behavior: Behavior is cooperative.     No results found for any visits on 07/09/23.  Assessment & Plan    There are no diagnoses linked to this encounter.  ***  No follow-ups on file.       Manuelita Flatness, PA-C  [facility] at [facility] [phone] (phone) [phone] (fax)  [facility]

## 2023-07-12 NOTE — Assessment & Plan Note (Signed)
-   Prescribe escitalopram 10 mg to be taken in the evening with dinner to minimize nausea. -reviewed potential SE.   - Prescribe trazodone as needed for sleep, starting with half a tablet and adjusting as necessary.

## 2023-07-12 NOTE — Assessment & Plan Note (Signed)
 Significantly elevated in office, unusual for her. Re-starting lisinopril  10 mg, she had been off for a while. F/u 2-4 weeks

## 2023-07-12 NOTE — Assessment & Plan Note (Signed)
 She is on 10 mg equivalent of compounded tirzepatide.  She has about a 6 mo supply left.  Suggested tapering down slowly, can extend dosing to Q 10-14 days instead of weekly. Recommending tapering down and staying on 2.5 mg equiv dose for at least 1-2 months to avoid rebound of d/c med   - Check expiration dates on tirzepatide vials to ensure potency.

## 2023-07-16 ENCOUNTER — Other Ambulatory Visit: Payer: Self-pay | Admitting: Medical Genetics

## 2023-07-23 ENCOUNTER — Encounter: Payer: Self-pay | Admitting: Physician Assistant

## 2023-07-23 ENCOUNTER — Ambulatory Visit (INDEPENDENT_AMBULATORY_CARE_PROVIDER_SITE_OTHER): Admitting: Physician Assistant

## 2023-07-23 VITALS — BP 136/98 | HR 71 | Ht 64.0 in | Wt 211.0 lb

## 2023-07-23 DIAGNOSIS — F419 Anxiety disorder, unspecified: Secondary | ICD-10-CM | POA: Diagnosis not present

## 2023-07-23 DIAGNOSIS — I10 Essential (primary) hypertension: Secondary | ICD-10-CM

## 2023-07-23 DIAGNOSIS — E559 Vitamin D deficiency, unspecified: Secondary | ICD-10-CM

## 2023-07-23 NOTE — Assessment & Plan Note (Signed)
 Cont lexapro  10, f/b 3-4 mo or earlier if needed

## 2023-07-23 NOTE — Assessment & Plan Note (Addendum)
 More appropriate control Cont lisinopril  10 mg . Repeat bmp next mo  F/u 4 mo

## 2023-07-23 NOTE — Progress Notes (Signed)
 Established patient visit   Patient: Allison Valdez   DOB: Feb 15, 1985   38 y.o. Female  MRN: [id number] Visit Date: 07/23/2023  Today's healthcare provider: Manuelita Flatness, PA-C   Cc. Htn f/u  Subjective     Hypertension, follow-up  BP Readings from Last 3 Encounters:  07/23/23 (!) 136/98  07/09/23 (!) 180/122  11/17/22 137/84   Wt Readings from Last 3 Encounters:  07/23/23 211 lb (95.7 kg)  07/09/23 210 lb 6.4 oz (95.4 kg)  11/17/22 237 lb (107.5 kg)     We restarted lisinopril  10 mg two weeks ago.   Outside blood pressures are 130s-140s/90s Pertinent labs Lab Results  Component Value Date   CHOL 205 (H) 11/18/2022   HDL 68 11/18/2022   LDLCALC 115 (H) 11/18/2022   TRIG 128 11/18/2022   CHOLHDL 3.0 11/18/2022   Lab Results  Component Value Date   NA 140 11/18/2022   K 4.2 11/18/2022   CREATININE 0.80 11/18/2022   EGFR 97 11/18/2022   GLUCOSE 96 11/18/2022   TSH 1.710 11/18/2022     The ASCVD Risk score (Arnett DK, et al., 2019) failed to calculate for the following reasons:   The 2019 ASCVD risk score is only valid for ages 58 to 65  ---------------------------------------------------------------------------------------------------  Pt reports she may be sleeping better at night. Overall feels better.  Medications: Outpatient Medications Prior to Visit  Medication Sig   escitalopram  (LEXAPRO ) 10 MG tablet Take 1 tablet (10 mg total) by mouth daily.   levonorgestrel  (MIRENA ) 20 MCG/24HR IUD 1 each by Intrauterine route once.   lisinopril  (ZESTRIL ) 10 MG tablet Take 1 tablet (10 mg total) by mouth daily.   Tirzepatide-Weight Management (ZEPBOUND ) Inject 10 mg into the skin once a week. Compounded medicine   traZODone  (DESYREL ) 50 MG tablet Take 0.5-1 tablets (25-50 mg total) by mouth at bedtime as needed.   No facility-administered medications prior to visit.    Review of Systems  Constitutional:  Negative for fatigue and fever.   Respiratory:  Negative for cough and shortness of breath.   Cardiovascular:  Negative for chest pain and leg swelling.  Gastrointestinal:  Negative for abdominal pain.  Neurological:  Negative for dizziness and headaches.       Objective    BP (!) 136/98   Pulse 71   Ht 5' 4 (1.626 m)   Wt 211 lb (95.7 kg)   BMI 36.22 kg/m    Physical Exam Constitutional:      General: She is awake.     Appearance: She is well-developed.  HENT:     Head: Normocephalic.  Eyes:     Conjunctiva/sclera: Conjunctivae normal.  Cardiovascular:     Rate and Rhythm: Normal rate and regular rhythm.     Heart sounds: Normal heart sounds.  Pulmonary:     Effort: Pulmonary effort is normal.  Skin:    General: Skin is warm.  Neurological:     Mental Status: She is alert and oriented to person, place, and time.  Psychiatric:        Attention and Perception: Attention normal.        Mood and Affect: Mood normal.        Speech: Speech normal.        Behavior: Behavior is cooperative.      No results found for any visits on 07/23/23.  Assessment & Plan    Primary hypertension Assessment & Plan: More appropriate control Cont  lisinopril  10 mg . Repeat bmp next mo  F/u 4 mo  Orders: -     Basic metabolic panel with GFR; Future  Vitamin D  deficiency -     VITAMIN D  25 Hydroxy (Vit-D Deficiency, Fractures); Future  Anxiety Assessment & Plan: Cont lexapro  10, f/b 3-4 mo or earlier if needed      Return in about 4 months (around 11/23/2023) for hypertension, anxiety, insomnia.       Manuelita Flatness, PA-C  [facility] at [facility] [phone] (phone) [phone] (fax)  [facility]

## 2023-08-04 ENCOUNTER — Other Ambulatory Visit: Payer: Self-pay | Admitting: *Deleted

## 2023-08-04 ENCOUNTER — Encounter: Payer: Self-pay | Admitting: Physician Assistant

## 2023-08-04 DIAGNOSIS — G4700 Insomnia, unspecified: Secondary | ICD-10-CM

## 2023-08-04 DIAGNOSIS — F419 Anxiety disorder, unspecified: Secondary | ICD-10-CM

## 2023-08-04 MED ORDER — TRAZODONE HCL 50 MG PO TABS: 25.0000 mg | ORAL_TABLET | Freq: Every evening | ORAL | 1 refills | Status: DC | PRN

## 2023-09-29 ENCOUNTER — Telehealth: Payer: Self-pay | Admitting: Physician Assistant

## 2023-09-29 NOTE — Telephone Encounter (Signed)
 Pt was a Allison Valdez pt and needs her trazodone  refilled.

## 2023-09-29 NOTE — Telephone Encounter (Signed)
 Pt was given #90 with one refill on 08/04/23.

## 2023-10-06 ENCOUNTER — Other Ambulatory Visit: Payer: Self-pay | Admitting: Family Medicine

## 2023-10-06 DIAGNOSIS — F419 Anxiety disorder, unspecified: Secondary | ICD-10-CM

## 2023-10-06 DIAGNOSIS — G4700 Insomnia, unspecified: Secondary | ICD-10-CM

## 2023-10-06 MED ORDER — TRAZODONE HCL 50 MG PO TABS: 75.0000 mg | ORAL_TABLET | Freq: Every evening | ORAL | 1 refills | Status: AC | PRN

## 2023-10-06 NOTE — Telephone Encounter (Signed)
 Copied from CRM #[id number]. Topic: Clinical - Medication Refill >> Oct 06, 2023  2:10 PM Mia F wrote: Medication: traZODone  (DESYREL ) 50 MG tablet (Pt would like to change to 75 mg)  Has the patient contacted their pharmacy? Yes (Agent: If no, request that the patient contact the pharmacy for the refill. If patient does not wish to contact the pharmacy document the reason why and proceed with request.) (Agent: If yes, when and what did the pharmacy advise?)  This is the patient's preferred pharmacy:   [facility] - [place], [place] - [address] [place] [address] Phone: [phone] Fax: [phone]  Is this the correct pharmacy for this prescription? Yes If no, delete pharmacy and type the correct one.   Has the prescription been filled recently? Yes  Is the patient out of the medication? Yes  Has the patient been seen for an appointment in the last year OR does the patient have an upcoming appointment? Yes  Can we respond through MyChart? Yes  Agent: Please be advised that Rx refills may take up to 3 business days. We ask that you follow-up with your pharmacy.

## 2023-10-08 ENCOUNTER — Ambulatory Visit: Admitting: Family Medicine

## 2023-10-19 ENCOUNTER — Encounter: Payer: Self-pay | Admitting: Family Medicine

## 2023-10-19 ENCOUNTER — Ambulatory Visit (INDEPENDENT_AMBULATORY_CARE_PROVIDER_SITE_OTHER): Admitting: Family Medicine

## 2023-10-19 VITALS — BP 136/88 | HR 84 | Temp 98.0°F | Resp 16 | Ht 64.0 in | Wt 219.4 lb

## 2023-10-19 DIAGNOSIS — B001 Herpesviral vesicular dermatitis: Secondary | ICD-10-CM | POA: Diagnosis not present

## 2023-10-19 DIAGNOSIS — F419 Anxiety disorder, unspecified: Secondary | ICD-10-CM

## 2023-10-19 MED ORDER — VALACYCLOVIR HCL 1 G PO TABS: 1000.0000 mg | ORAL_TABLET | Freq: Three times a day (TID) | ORAL | 3 refills | Status: AC

## 2023-10-19 MED ORDER — BUPROPION HCL ER (XL) 150 MG PO TB24: 150.0000 mg | ORAL_TABLET | Freq: Every day | ORAL | 0 refills | Status: DC

## 2023-10-19 NOTE — Progress Notes (Signed)
 Subjective:    Patient ID: Allison Valdez, female    DOB: [birth year], 38 y.o.   MRN: [id number]  Chief Complaint  Patient presents with   Anxiety    Pt states medication is working great but states she is eating everything.   Follow-up    HPI Patient is in today for f/u anxiety .  Discussed the use of AI scribe software for clinical note transcription with the patient, who gave verbal consent to proceed.  History of Present Illness Allison Valdez is a 38 year old female who presents with weight gain associated with Lexapro  use.  She has experienced significant weight gain of approximately 10 to 12 pounds since starting Lexapro  in June or July. This weight gain is attributed to an increased appetite, as she states the medication makes her 'eat everything.' Despite the weight gain, Lexapro  effectively manages her anxiety.  She has a history of using other depression medications, including one she refers to as 'Zohal,' which she did not favor. She describes herself as not a 'sad person' but experiences 'terrible anxiety,' characterized by nervousness, worry, and anxiousness about everything.  Her current medication regimen includes Lexapro  for anxiety and trazodone , which she states is working extremely well for her sleep. She is concerned about the potential for further weight gain and is interested in exploring options to manage her appetite while maintaining anxiety control.  She lives in [place] and works in [employer]. She is married and mentions that her husband is 'a little hard headed.'  She describes herself as 'very happy' and not a 'sad person,' but confirms experiencing significant anxiety.    Past Medical History:  Diagnosis Date   Anxiety    Depression    Hypertension    Obesity    Vitamin D  deficiency disease     Past Surgical History:  Procedure Laterality Date   OVARIAN CYST REMOVAL Right    SPINE SURGERY     2/23 lumbar fusion     Family History  Problem Relation Age of Onset   Diabetes Mother    Diabetes Father     Social History   Socioeconomic History   Marital status: Married    Spouse name: Not on file   Number of children: Not on file   Years of education: Not on file   Highest education level: Bachelor's degree (e.g., BA, AB, BS)  Occupational History   Not on file  Tobacco Use   Smoking status: Never   Smokeless tobacco: Never  Vaping Use   Vaping status: Never Used  Substance and Sexual Activity   Alcohol use: Yes    Comment: occas   Drug use: No   Sexual activity: Yes    Birth control/protection: I.U.D.    Comment: Mirena   Other Topics Concern   Not on file  Social History Narrative   Not on file   Social Drivers of Health   Financial Resource Strain: Low Risk  (07/09/2023)   Overall Financial Resource Strain (CARDIA)    Difficulty of Paying Living Expenses: Not very hard  Food Insecurity: No Food Insecurity (07/09/2023)   Hunger Vital Sign    Worried About Running Out of Food in the Last Year: Never true    Ran Out of Food in the Last Year: Never true  Transportation Needs: No Transportation Needs (07/09/2023)   PRAPARE - Administrator, Civil Service (Medical): No    Lack of Transportation (Non-Medical): No  Physical Activity: Inactive (07/09/2023)   Exercise Vital Sign    Days of Exercise per Week: 0 days    Minutes of Exercise per Session: Not on file  Stress: Stress Concern Present (07/09/2023)   [employer] of Occupational Health - Occupational Stress Questionnaire    Feeling of Stress: Very much  Social Connections: Moderately Integrated (07/09/2023)   Social Connection and Isolation Panel    Frequency of Communication with Friends and Family: More than three times a week    Frequency of Social Gatherings with Friends and Family: Once a week    Attends Religious Services: 1 to 4 times per year    Active Member of [employer] or Organizations: No    Attends  Engineer, structural: Not on file    Marital Status: Married  Catering manager Violence: Not on file    Outpatient Medications Prior to Visit  Medication Sig Dispense Refill   escitalopram  (LEXAPRO ) 10 MG tablet Take 1 tablet (10 mg total) by mouth daily. 90 tablet 1   levonorgestrel  (MIRENA ) 20 MCG/24HR IUD 1 each by Intrauterine route once.     lisinopril  (ZESTRIL ) 10 MG tablet Take 1 tablet (10 mg total) by mouth daily. 90 tablet 3   Tirzepatide-Weight Management (ZEPBOUND [place]) Inject 10 mg into the skin once a week. Compounded medicine     traZODone  (DESYREL ) 50 MG tablet Take 1.5 tablets (75 mg total) by mouth at bedtime as needed. 135 tablet 1   No facility-administered medications prior to visit.    No Known Allergies  Review of Systems  Constitutional:  Negative for chills, fever and malaise/fatigue.  HENT:  Negative for congestion and hearing loss.   Eyes:  Negative for blurred vision and discharge.  Respiratory:  Negative for cough, sputum production and shortness of breath.   Cardiovascular:  Negative for chest pain, palpitations and leg swelling.  Gastrointestinal:  Negative for abdominal pain, blood in stool, constipation, diarrhea, heartburn, nausea and vomiting.  Genitourinary:  Negative for dysuria, frequency, hematuria and urgency.  Musculoskeletal:  Negative for back pain, falls and myalgias.  Skin:  Negative for rash.  Neurological:  Negative for dizziness, sensory change, loss of consciousness, weakness and headaches.  Endo/Heme/Allergies:  Negative for environmental allergies. Does not bruise/bleed easily.  Psychiatric/Behavioral:  Negative for depression and suicidal ideas. The patient is not nervous/anxious and does not have insomnia.        Objective:    Physical Exam Vitals and nursing note reviewed.  Constitutional:      General: She is not in acute distress.    Appearance: Normal appearance. She is well-developed.  HENT:     Head:  Normocephalic and atraumatic.     Mouth/Throat:     Comments: Cold sore L side lower lip Eyes:     General: No scleral icterus.       Right eye: No discharge.        Left eye: No discharge.  Cardiovascular:     Rate and Rhythm: Normal rate and regular rhythm.     Heart sounds: No murmur heard. Pulmonary:     Effort: Pulmonary effort is normal. No respiratory distress.     Breath sounds: Normal breath sounds.  Musculoskeletal:        General: Normal range of motion.     Cervical back: Normal range of motion and neck supple.     Right lower leg: No edema.     Left lower leg: No edema.  Skin:  General: Skin is warm and dry.  Neurological:     Mental Status: She is alert and oriented to person, place, and time.  Psychiatric:        Mood and Affect: Mood normal.        Behavior: Behavior normal.        Thought Content: Thought content normal.        Judgment: Judgment normal.     BP 136/88 (BP Location: Left Arm, Patient Position: Sitting, Cuff Size: Large)   Pulse 84   Temp 98 F (36.7 C) (Oral)   Resp 16   Ht 5' 4 (1.626 m)   Wt 219 lb 6.4 oz (99.5 kg)   SpO2 97%   BMI 37.66 kg/m  Wt Readings from Last 3 Encounters:  10/19/23 219 lb 6.4 oz (99.5 kg)  07/23/23 211 lb (95.7 kg)  07/09/23 210 lb 6.4 oz (95.4 kg)    Diabetic Foot Exam - Simple   No data filed    Lab Results  Component Value Date   WBC 5.0 11/18/2022   HGB 14.1 11/18/2022   HCT 41.2 11/18/2022   PLT 233 11/18/2022   GLUCOSE 96 11/18/2022   CHOL 205 (H) 11/18/2022   TRIG 128 11/18/2022   HDL 68 11/18/2022   LDLCALC 115 (H) 11/18/2022   ALT 24 11/18/2022   AST 27 11/18/2022   NA 140 11/18/2022   K 4.2 11/18/2022   CL 101 11/18/2022   CREATININE 0.80 11/18/2022   BUN 13 11/18/2022   CO2 22 11/18/2022   TSH 1.710 11/18/2022   HGBA1C 5.3 11/18/2022    Lab Results  Component Value Date   TSH 1.710 11/18/2022   Lab Results  Component Value Date   WBC 5.0 11/18/2022   HGB 14.1  11/18/2022   HCT 41.2 11/18/2022   MCV 94 11/18/2022   PLT 233 11/18/2022   Lab Results  Component Value Date   NA 140 11/18/2022   K 4.2 11/18/2022   CO2 22 11/18/2022   GLUCOSE 96 11/18/2022   BUN 13 11/18/2022   CREATININE 0.80 11/18/2022   BILITOT 0.6 11/18/2022   ALKPHOS 46 11/18/2022   AST 27 11/18/2022   ALT 24 11/18/2022   PROT 6.9 11/18/2022   ALBUMIN 4.5 11/18/2022   CALCIUM 9.1 11/18/2022   ANIONGAP 4 (L) 01/29/2012   EGFR 97 11/18/2022   Lab Results  Component Value Date   CHOL 205 (H) 11/18/2022   Lab Results  Component Value Date   HDL 68 11/18/2022   Lab Results  Component Value Date   LDLCALC 115 (H) 11/18/2022   Lab Results  Component Value Date   TRIG 128 11/18/2022   Lab Results  Component Value Date   CHOLHDL 3.0 11/18/2022   Lab Results  Component Value Date   HGBA1C 5.3 11/18/2022       Assessment & Plan:  Anxiety -     buPROPion HCl ER (XL); Take 1 tablet (150 mg total) by mouth daily.  Dispense: 30 tablet; Refill: 0  Cold sore -     valACYclovir HCl; Take 1 tablet (1,000 mg total) by mouth 3 (three) times daily.  Dispense: 30 tablet; Refill: 3  Assessment and Plan Assessment & Plan Generalized anxiety disorder   Her generalized anxiety disorder is well-controlled with Lexapro , showing significant symptom improvement since starting in June or July. There is concern that Wellbutrin, intended to counteract weight gain, might exacerbate anxiety, but Lexapro  is expected to mitigate this  effect. Continue Lexapro  for anxiety management. Start Wellbutrin at 150 mg daily in the morning to counteract weight gain and monitor for any increase in anxiety symptoms. Increase Wellbutrin to 300 mg daily if no adverse effects are noted and no improvement in appetite is observed after 7-10 days. Advise taking Wellbutrin in the morning to avoid insomnia.  Weight gain due to Lexapro    She has gained 10-12 pounds since starting Lexapro  in June or July.  While Lexapro  effectively manages anxiety, it has led to increased appetite and weight gain. Wellbutrin is considered to help curb appetite and counteract weight gain. It may also increase libido, which could be beneficial. Start Wellbutrin at 150 mg daily to help curb appetite and counteract weight gain. Increase Wellbutrin to 300 mg daily if no improvement in appetite is observed after 7-10 days.    Allison Marano R Lowne Chase, DO

## 2023-10-28 ENCOUNTER — Other Ambulatory Visit: Payer: Self-pay | Admitting: Medical Genetics

## 2023-10-28 DIAGNOSIS — Z006 Encounter for examination for normal comparison and control in clinical research program: Secondary | ICD-10-CM

## 2023-11-12 ENCOUNTER — Encounter: Payer: Self-pay | Admitting: Family Medicine

## 2023-11-14 ENCOUNTER — Other Ambulatory Visit: Payer: Self-pay | Admitting: Family Medicine

## 2023-11-14 MED ORDER — BUPROPION HCL ER (XL) 300 MG PO TB24: 300.0000 mg | ORAL_TABLET | Freq: Every day | ORAL | 1 refills | Status: DC

## 2023-11-15 MED ORDER — BUPROPION HCL ER (XL) 300 MG PO TB24: 300.0000 mg | ORAL_TABLET | Freq: Every day | ORAL | 1 refills | Status: DC

## 2023-11-26 DIAGNOSIS — E559 Vitamin D deficiency, unspecified: Secondary | ICD-10-CM | POA: Insufficient documentation

## 2023-11-26 NOTE — Patient Instructions (Signed)

## 2023-12-02 ENCOUNTER — Ambulatory Visit (INDEPENDENT_AMBULATORY_CARE_PROVIDER_SITE_OTHER): Admitting: Nurse Practitioner

## 2023-12-02 ENCOUNTER — Encounter: Payer: Self-pay | Admitting: Nurse Practitioner

## 2023-12-02 VITALS — BP 138/88 | HR 99 | Temp 98.4°F | Resp 16 | Ht 63.58 in | Wt 213.8 lb

## 2023-12-02 DIAGNOSIS — E782 Mixed hyperlipidemia: Secondary | ICD-10-CM | POA: Diagnosis not present

## 2023-12-02 DIAGNOSIS — I10 Essential (primary) hypertension: Secondary | ICD-10-CM | POA: Diagnosis not present

## 2023-12-02 DIAGNOSIS — F419 Anxiety disorder, unspecified: Secondary | ICD-10-CM

## 2023-12-02 DIAGNOSIS — E559 Vitamin D deficiency, unspecified: Secondary | ICD-10-CM | POA: Diagnosis not present

## 2023-12-02 DIAGNOSIS — Z7689 Persons encountering health services in other specified circumstances: Secondary | ICD-10-CM

## 2023-12-02 DIAGNOSIS — E669 Obesity, unspecified: Secondary | ICD-10-CM

## 2023-12-02 MED ORDER — LISINOPRIL 20 MG PO TABS: 20.0000 mg | ORAL_TABLET | Freq: Every day | ORAL | 3 refills | Status: DC

## 2023-12-02 NOTE — Assessment & Plan Note (Signed)
 Ongoing on past labs. Recheck in 4 weeks at physical. Continue focus on diet and regular exercise. The ASCVD Risk score (Arnett DK, et al., 2019) failed to calculate for the following reasons:   The 2019 ASCVD risk score is only valid for ages 24 to 82

## 2023-12-02 NOTE — Assessment & Plan Note (Signed)
 BMI 37.18, will continue Zepbound from online which is offering benefit. Recommended eating smaller high protein, low fat meals more frequently and exercising 30 mins a day 5 times a week with a goal of 10-15lb weight loss in the next 3 months. Patient voiced their understanding and motivation to adhere to these recommendations.

## 2023-12-02 NOTE — Assessment & Plan Note (Signed)
 Chronic, stable with current regimen. Denies SI/HI. Continue current medication regimen and adjust as needed.

## 2023-12-02 NOTE — Assessment & Plan Note (Signed)
 Noted on past labs, discussed with patient today. Recommend she start Vitamin D3 2000 units daily.

## 2023-12-02 NOTE — Progress Notes (Signed)
 New Patient Office Visit  Subjective    Patient ID: Analilia Geddis, female    DOB: Apr 25, 1985  Age: 38 y.o. MRN: [id number]  CC:  Chief Complaint  Patient presents with   Establish Care    Nothing acute today however looking to establish care closer to home.     HPI Cara Nusaiba Guallpa presents for new patient visit to establish care.  Introduced to publishing rights manager role and practice setting.  All questions answered.  Discussed provider/patient relationship and expectations. Transferring over from [place] in [place].  Has history of lumbar fusion February 2023.  Currently follows with neurosurgeon, Dr. Marquita.    HYPERTENSION without Chronic Kidney Disease Takes Lisinopril , has taken consistently for one year.  Taking Zepbound from [employer], has been on current dose for one year. Lost about 35 pounds since starting.   Hypertension status: stable  Satisfied with current treatment? yes Duration of hypertension: chronic BP monitoring frequency:  a few times a month BP range: 140-150/80-90 range BP medication side effects:  no Medication compliance: good compliance Aspirin: no Recurrent headaches: will get headaches when BP elevated Visual changes: no Palpitations: no Dyspnea: no Chest pain: no Lower extremity edema: no Dizzy/lightheaded: no   ANXIETY/STRESS Takes Lexapro  10 MG, has been on for 6 months. Wellbutrin  recently added to help mood and weight gain with Lexapro .  Takes Trazodone  nightly for sleep. Duration:stable Anxious mood: no  Excessive worrying: no Irritability: no  Sweating: no Nausea: no Palpitations:no Hyperventilation: no Panic attacks: no Agoraphobia: no  Obscessions/compulsions: no Depressed mood: no    2023/12/25    9:42 AM 10/19/2023    4:31 PM 07/09/2023    3:39 PM 11/17/2022    9:13 AM 09/22/2021    3:06 PM  Depression screen PHQ 2/9  Decreased Interest 0 0 0 0 0  Down, Depressed, Hopeless 0 0 0 0 0  PHQ - 2 Score 0 0 0  0 0  Altered sleeping 0 0 2  0  Tired, decreased energy 1 1 1   0  Change in appetite 1 3 0  0  Feeling bad or failure about yourself  0 0 0  0  Trouble concentrating 0 2 0  0  Moving slowly or fidgety/restless 0 0 0  0  Suicidal thoughts 0 0 0  0  PHQ-9 Score 2 6  3    0   Difficult doing work/chores  Somewhat difficult Not difficult at all  Not difficult at all     Data saved with a previous flowsheet row definition  Anhedonia: no Weight changes: no Insomnia: yes hard to stay asleep if does not take Trazodone  Hypersomnia: no Fatigue/loss of energy: sometimes Feelings of worthlessness: no Feelings of guilt: no Impaired concentration/indecisiveness: no Suicidal ideations: no  Crying spells: no Recent Stressors/Life Changes: no   Relationship problems: no   Family stress: no     Financial stress: no    Job stress: no    Recent death/loss: no     12/25/2023    9:42 AM 07/09/2023    3:40 PM  GAD 7 : Generalized Anxiety Score  Nervous, Anxious, on Edge 0 3  Control/stop worrying 0 3  Worry too much - different things 0 3  Trouble relaxing 0 3  Restless 0 3  Easily annoyed or irritable 0 3  Afraid - awful might happen 0 2  Total GAD 7 Score 0 20  Anxiety Difficulty  Very difficult   Outpatient  Encounter Medications as of 12/02/2023  Medication Sig   buPROPion  (WELLBUTRIN  XL) 300 MG 24 hr tablet Take 1 tablet (300 mg total) by mouth daily.   escitalopram  (LEXAPRO ) 10 MG tablet Take 1 tablet (10 mg total) by mouth daily.   levonorgestrel  (MIRENA ) 20 MCG/24HR IUD 1 each by Intrauterine route once.   lisinopril  (ZESTRIL ) 20 MG tablet Take 1 tablet (20 mg total) by mouth daily.   Tirzepatide-Weight Management (ZEPBOUND [place]) Inject 10 mg into the skin once a week. Compounded medicine   traZODone  (DESYREL ) 50 MG tablet Take 1.5 tablets (75 mg total) by mouth at bedtime as needed.   valACYclovir  (VALTREX ) 1000 MG tablet Take 1 tablet (1,000 mg total) by mouth 3 (three) times daily.    [DISCONTINUED] lisinopril  (ZESTRIL ) 10 MG tablet Take 1 tablet (10 mg total) by mouth daily.   No facility-administered encounter medications on file as of 12/02/2023.    Past Medical History:  Diagnosis Date   Anxiety    Depression    Hypertension    Obesity    Vitamin D  deficiency disease     Past Surgical History:  Procedure Laterality Date   OVARIAN CYST REMOVAL Right    SPINE SURGERY  02/17/21   2/23 lumbar fusion    Family History  Problem Relation Age of Onset   Diabetes Mother    Hypertension Mother    Cancer Father        Liver Cancer   Diabetes Father    Hypertension Father    Hepatitis C Father    Healthy Brother    Diabetes Maternal Grandmother    Heart attack Maternal Grandfather    Birth defects Paternal Grandmother        Breast    Social History   Socioeconomic History   Marital status: Married    Spouse name: Not on file   Number of children: Not on file   Years of education: Not on file   Highest education level: Bachelor's degree (e.g., BA, AB, BS)  Occupational History   Not on file  Tobacco Use   Smoking status: Never   Smokeless tobacco: Never  Vaping Use   Vaping status: Never Used  Substance and Sexual Activity   Alcohol use: Yes    Comment: occas   Drug use: No   Sexual activity: Yes    Birth control/protection: I.U.D.    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   Social Drivers of Corporate Investment Banker Strain: Low Risk  (11/28/2023)   Overall Financial Resource Strain (CARDIA)    Difficulty of Paying Living Expenses: Not very hard  Food Insecurity: No Food Insecurity (11/28/2023)   Hunger Vital Sign    Worried About Running Out of Food in the Last Year: Never true    Ran Out of Food in the Last Year: Never true  Transportation Needs: No Transportation Needs (11/28/2023)   PRAPARE - Administrator, Civil Service (Medical):  No    Lack of Transportation (Non-Medical): No  Physical Activity: Inactive (11/28/2023)   Exercise Vital Sign    Days of Exercise per Week: 0 days    Minutes of Exercise per Session: Not on file  Stress: Stress Concern Present (11/28/2023)   [employer] of Occupational Health - Occupational Stress Questionnaire    Feeling of Stress: To some extent  Social Connections: Moderately Isolated (11/28/2023)  Social Advertising Account Executive    Frequency of Communication with Friends and Family: More than three times a week    Frequency of Social Gatherings with Friends and Family: Once a week    Attends Religious Services: Never    Database Administrator or Organizations: No    Attends Engineer, Structural: Not on file    Marital Status: Married  Catering Manager Violence: Not At Risk (12/02/2023)   Humiliation, Afraid, Rape, and Kick questionnaire    Fear of Current or Ex-Partner: No    Emotionally Abused: No    Physically Abused: No    Sexually Abused: No    Review of Systems  Constitutional:  Negative for chills, diaphoresis, fever and weight loss.  Respiratory:  Negative for cough, shortness of breath and wheezing.   Cardiovascular:  Negative for chest pain, palpitations, orthopnea and leg swelling.  Neurological: Negative.   Endo/Heme/Allergies: Negative.   Psychiatric/Behavioral: Negative.          Objective    BP 138/88 (BP Location: Left Arm, Patient Position: Sitting, Cuff Size: Large)   Pulse 99   Temp 98.4 F (36.9 C) (Oral)   Resp 16   Ht 5' 3.58 (1.615 m)   Wt 213 lb 12.8 oz (97 kg)   SpO2 98%   BMI 37.18 kg/m   Physical Exam Vitals and nursing note reviewed.  Constitutional:      General: She is awake. She is not in acute distress.    Appearance: She is well-developed and well-groomed. She is not ill-appearing or toxic-appearing.  HENT:     Head: Normocephalic.     Right Ear: Hearing and external ear normal.     Left Ear: Hearing  and external ear normal.  Eyes:     General: Lids are normal.        Right eye: No discharge.        Left eye: No discharge.     Conjunctiva/sclera: Conjunctivae normal.     Pupils: Pupils are equal, round, and reactive to light.  Neck:     Thyroid : No thyromegaly.     Vascular: No carotid bruit.  Cardiovascular:     Rate and Rhythm: Normal rate and regular rhythm.     Heart sounds: Normal heart sounds. No murmur heard.    No gallop.  Pulmonary:     Effort: Pulmonary effort is normal. No accessory muscle usage or respiratory distress.     Breath sounds: Normal breath sounds.  Abdominal:     General: Bowel sounds are normal. There is no distension.     Palpations: Abdomen is soft.     Tenderness: There is no abdominal tenderness.  Musculoskeletal:     Cervical back: Normal range of motion and neck supple.     Right lower leg: No edema.     Left lower leg: No edema.  Lymphadenopathy:     Cervical: No cervical adenopathy.  Skin:    General: Skin is warm and dry.  Neurological:     Mental Status: She is alert and oriented to person, place, and time.     Deep Tendon Reflexes: Reflexes are normal and symmetric.     Reflex Scores:      Brachioradialis reflexes are 2+ on the right side and 2+ on the left side.      Patellar reflexes are 2+ on the right side and 2+ on the left side. Psychiatric:        Attention and  Perception: Attention normal.        Mood and Affect: Mood normal.        Speech: Speech normal.        Behavior: Behavior normal. Behavior is cooperative.        Thought Content: Thought content normal.    Last CBC Lab Results  Component Value Date   WBC 5.0 11/18/2022   HGB 14.1 11/18/2022   HCT 41.2 11/18/2022   MCV 94 11/18/2022   MCH 32.3 11/18/2022   RDW 12.5 11/18/2022   PLT 233 11/18/2022   Last metabolic panel Lab Results  Component Value Date   GLUCOSE 96 11/18/2022   NA 140 11/18/2022   K 4.2 11/18/2022   CL 101 11/18/2022   CO2 22 11/18/2022    BUN 13 11/18/2022   CREATININE 0.80 11/18/2022   EGFR 97 11/18/2022   CALCIUM 9.1 11/18/2022   PROT 6.9 11/18/2022   ALBUMIN 4.5 11/18/2022   LABGLOB 2.4 11/18/2022   AGRATIO 2.2 07/05/2020   BILITOT 0.6 11/18/2022   ALKPHOS 46 11/18/2022   AST 27 11/18/2022   ALT 24 11/18/2022   ANIONGAP 4 (L) 01/29/2012   Last lipids Lab Results  Component Value Date   CHOL 205 (H) 11/18/2022   HDL 68 11/18/2022   LDLCALC 115 (H) 11/18/2022   TRIG 128 11/18/2022   CHOLHDL 3.0 11/18/2022   Last hemoglobin A1c Lab Results  Component Value Date   HGBA1C 5.3 11/18/2022   Last thyroid  functions Lab Results  Component Value Date   TSH 1.710 11/18/2022   FREET4 0.97 11/18/2022   Last vitamin D  Lab Results  Component Value Date   VD25OH 18.1 (L) 11/18/2022      Assessment & Plan:   Problem List Items Addressed This Visit       Cardiovascular and Mediastinum   Hypertension - Primary   Chronic, with elevation in office and at home. Will increase Lisinopril  to 20 MG daily, educated her on this. Can finish out 10 MG tablets by taking 2 of these. Recommend she monitor BP at least a few mornings a week at home and document.  DASH diet at home. Labs today: obtain in 4 weeks at physical.       Relevant Medications   lisinopril  (ZESTRIL ) 20 MG tablet     Other   Vitamin D  deficiency   Noted on past labs, discussed with patient today. Recommend she start Vitamin D3 2000 units daily.      Obesity (BMI 30-39.9)   BMI 37.18, will continue Zepbound from online which is offering benefit. Recommended eating smaller high protein, low fat meals more frequently and exercising 30 mins a day 5 times a week with a goal of 10-15lb weight loss in the next 3 months. Patient voiced their understanding and motivation to adhere to these recommendations.       Moderate mixed hyperlipidemia not requiring statin therapy   Ongoing on past labs. Recheck in 4 weeks at physical. Continue focus on diet and  regular exercise. The ASCVD Risk score (Arnett DK, et al., 2019) failed to calculate for the following reasons:   The 2019 ASCVD risk score is only valid for ages 47 to 74       Relevant Medications   lisinopril  (ZESTRIL ) 20 MG tablet   Anxiety   Chronic, stable with current regimen. Denies SI/HI. Continue current medication regimen and adjust as needed.      Other Visit Diagnoses  Encounter to establish care       New patient to practice, introduced to practice and provider.       Return in about 4 weeks (around 12/30/2023) for Annual Physical.   Keiri Solano T Riverlyn Kizziah, NP

## 2023-12-02 NOTE — Assessment & Plan Note (Signed)
 Chronic, with elevation in office and at home. Will increase Lisinopril  to 20 MG daily, educated her on this. Can finish out 10 MG tablets by taking 2 of these. Recommend she monitor BP at least a few mornings a week at home and document.  DASH diet at home. Labs today: obtain in 4 weeks at physical.

## 2023-12-06 LAB — GENECONNECT MOLECULAR SCREEN: Genetic Analysis Overall Interpretation: NEGATIVE

## 2023-12-26 NOTE — Patient Instructions (Incomplete)
 Be Involved in Caring For Your Health:  Taking Medications When medications are taken as directed, they can greatly improve your health. But if they are not taken as prescribed, they may not work. In some cases, not taking them correctly can be harmful. To help ensure your treatment remains effective and safe, understand your medications and how to take them. Bring your medications to each visit for review by your provider.  Your lab results, notes, and after visit summary will be available on My Chart. We strongly encourage you to use this feature. If lab results are abnormal the clinic will contact you with the appropriate steps. If the clinic does not contact you assume the results are satisfactory. You can always view your results on My Chart. If you have questions regarding your health or results, please contact the clinic during office hours. You can also ask questions on My Chart.  We at [facility] are grateful that you chose us  to provide your care. We strive to provide evidence-based and compassionate care and are always looking for feedback. If you get a survey from the clinic please complete this so we can hear your opinions.  DASH Eating Plan DASH stands for Dietary Approaches to Stop Hypertension. The DASH eating plan is a healthy eating plan that has been shown to: Lower high blood pressure (hypertension). Reduce your risk for type 2 diabetes, heart disease, and stroke. Help with weight loss. What are tips for following this plan? Reading food labels Check food labels for the amount of salt (sodium) per serving. Choose foods with less than 5 percent of the Daily Value (DV) of sodium. In general, foods with less than 300 milligrams (mg) of sodium per serving fit into this eating plan. To find whole grains, look for the word whole as the first word in the ingredient list. Shopping Buy products labeled as low-sodium or no salt added. Buy fresh foods. Avoid canned  foods and pre-made or frozen meals. Cooking Try not to add salt when you cook. Use salt-free seasonings or herbs instead of table salt or sea salt. Check with your health care provider or pharmacist before using salt substitutes. Do not fry foods. Cook foods in healthy ways, such as baking, boiling, grilling, roasting, or broiling. Cook using oils that are good for your heart. These include olive, canola, avocado, soybean, and sunflower oil. Meal planning  Eat a balanced diet. This should include: 4 or more servings of fruits and 4 or more servings of vegetables each day. Try to fill half of your plate with fruits and vegetables. 6-8 servings of whole grains each day. 6 or less servings of lean meat, poultry, or fish each day. 1 oz is 1 serving. A 3 oz (85 g) serving of meat is about the same size as the palm of your hand. One egg is 1 oz (28 g). 2-3 servings of low-fat dairy each day. One serving is 1 cup (237 mL). 1 serving of nuts, seeds, or beans 5 times each week. 2-3 servings of heart-healthy fats. Healthy fats called omega-3 fatty acids are found in foods such as walnuts, flaxseeds, fortified milks, and eggs. These fats are also found in cold-water fish, such as sardines, salmon, and mackerel. Limit how much you eat of: Canned or prepackaged foods. Food that is high in trans fat, such as fried foods. Food that is high in saturated fat, such as fatty meat. Desserts and other sweets, sugary drinks, and other foods with added sugar. Full-fat  dairy products. Do not salt foods before eating. Do not eat more than 4 egg yolks a week. Try to eat at least 2 vegetarian meals a week. Eat more home-cooked food and less restaurant, buffet, and fast food. Lifestyle When eating at a restaurant, ask if your food can be made with less salt or no salt. If you drink alcohol: Limit how much you have to: 0-1 drink a day if you are female. 0-2 drinks a day if you are female. Know how much alcohol is in  your drink. In the [place] one drink is one 12 oz bottle of beer (355 mL), one 5 oz glass of wine (148 mL), or one 1 oz glass of hard liquor (44 mL). General information Avoid eating more than 2,300 mg of salt a day. If you have hypertension, you may need to reduce your sodium intake to 1,500 mg a day. Work with your provider to stay at a healthy body weight or lose weight. Ask what the best weight range is for you. On most days of the week, get at least 30 minutes of exercise that causes your heart to beat faster. This may include walking, swimming, or biking. Work with your provider or dietitian to adjust your eating plan to meet your specific calorie needs. What foods should I eat? Fruits All fresh, dried, or frozen fruit. Canned fruits that are in their natural juice and do not have sugar added to them. Vegetables Fresh or frozen vegetables that are raw, steamed, roasted, or grilled. Low-sodium or reduced-sodium tomato and vegetable juice. Low-sodium or reduced-sodium tomato sauce and tomato paste. Low-sodium or reduced-sodium canned vegetables. Grains Whole-grain or whole-wheat bread. Whole-grain or whole-wheat pasta. Brown rice. [place]. Bulgur. Whole-grain and low-sodium cereals. Pita bread. Low-fat, low-sodium crackers. Whole-wheat flour tortillas. Meats and other proteins Skinless chicken or [place]. Ground chicken or [place]. Pork with fat trimmed off. Fish and seafood. Egg whites. Dried beans, peas, or lentils. Unsalted nuts, nut butters, and seeds. Unsalted canned beans. Lean cuts of beef with fat trimmed off. Low-sodium, lean precooked or cured meat, such as sausages or meat loaves. Dairy Low-fat (1%) or fat-free (skim) milk. Reduced-fat, low-fat, or fat-free cheeses. Nonfat, low-sodium ricotta or cottage cheese. Low-fat or nonfat yogurt. Low-fat, low-sodium cheese. Fats and oils Soft margarine without trans fats. Vegetable oil. Reduced-fat, low-fat, or light mayonnaise and salad  dressings (reduced-sodium). Canola, safflower, olive, avocado, soybean, and sunflower oils. Avocado. Seasonings and condiments Herbs. Spices. Seasoning mixes without salt. Other foods Unsalted popcorn and pretzels. Fat-free sweets. The items listed above may not be all the foods and drinks you can have. Talk to a dietitian to learn more. What foods should I avoid? Fruits Canned fruit in a light or heavy syrup. Fried fruit. Fruit in cream or butter sauce. Vegetables Creamed or fried vegetables. Vegetables in a cheese sauce. Regular canned vegetables that are not marked as low-sodium or reduced-sodium. Regular canned tomato sauce and paste that are not marked as low-sodium or reduced-sodium. Regular tomato and vegetable juices that are not marked as low-sodium or reduced-sodium. Dene. Olives. Grains Baked goods made with fat, such as croissants, muffins, or some breads. Dry pasta or rice meal packs. Meats and other proteins Fatty cuts of meat. Ribs. Fried meat. [place]. Bologna, salami, and other precooked or cured meats, such as sausages or meat loaves, that are not lean and low in sodium. Fat from the back of a pig (fatback). Bratwurst. Salted nuts and seeds. Canned beans with added salt. Canned  or smoked fish. Whole eggs or egg yolks. Chicken or [place] with skin. Dairy Whole or 2% milk, cream, and half-and-half. Whole or full-fat cream cheese. Whole-fat or sweetened yogurt. Full-fat cheese. Nondairy creamers. Whipped toppings. Processed cheese and cheese spreads. Fats and oils Butter. Stick margarine. Lard. Shortening. Ghee. Bacon fat. Tropical oils, such as coconut, palm kernel, or palm oil. Seasonings and condiments Onion salt, garlic salt, seasoned salt, table salt, and sea salt. Worcestershire sauce. Tartar sauce. Barbecue sauce. Teriyaki sauce. Soy sauce, including reduced-sodium soy sauce. Steak sauce. Canned and packaged gravies. Fish sauce. Oyster sauce. Cocktail sauce. Store-bought  horseradish. Ketchup. Mustard. Meat flavorings and tenderizers. Bouillon cubes. Hot sauces. Pre-made or packaged marinades. Pre-made or packaged taco seasonings. Relishes. Regular salad dressings. Other foods Salted popcorn and pretzels. The items listed above may not be all the foods and drinks you should avoid. Talk to a dietitian to learn more. Where to find more information National Heart, Lung, and Blood Institute (NHLBI): BuffaloDryCleaner.gl American Heart Association (AHA): heart.org Academy of Nutrition and Dietetics: eatright.org National Kidney Foundation (NKF): kidney.org This information is not intended to replace advice given to you by your health care provider. Make sure you discuss any questions you have with your health care provider. Document Revised: 01/15/2022 Document Reviewed: 01/15/2022 Elsevier Patient Education  2024 [employer].

## 2023-12-30 ENCOUNTER — Ambulatory Visit: Admitting: Nurse Practitioner

## 2024-01-29 NOTE — Patient Instructions (Incomplete)
 Be Involved in Caring For Your Health:  Taking Medications When medications are taken as directed, they can greatly improve your health. But if they are not taken as prescribed, they may not work. In some cases, not taking them correctly can be harmful. To help ensure your treatment remains effective and safe, understand your medications and how to take them. Bring your medications to each visit for review by your provider.  Your lab results, notes, and after visit summary will be available on My Chart. We strongly encourage you to use this feature. If lab results are abnormal the clinic will contact you with the appropriate steps. If the clinic does not contact you assume the results are satisfactory. You can always view your results on My Chart. If you have questions regarding your health or results, please contact the clinic during office hours. You can also ask questions on My Chart.  We at [facility] are grateful that you chose us  to provide your care. We strive to provide evidence-based and compassionate care and are always looking for feedback. If you get a survey from the clinic please complete this so we can hear your opinions.  DASH Eating Plan DASH stands for Dietary Approaches to Stop Hypertension. The DASH eating plan is a healthy eating plan that has been shown to: Lower high blood pressure (hypertension). Reduce your risk for type 2 diabetes, heart disease, and stroke. Help with weight loss. What are tips for following this plan? Reading food labels Check food labels for the amount of salt (sodium) per serving. Choose foods with less than 5 percent of the Daily Value (DV) of sodium. In general, foods with less than 300 milligrams (mg) of sodium per serving fit into this eating plan. To find whole grains, look for the word whole as the first word in the ingredient list. Shopping Buy products labeled as low-sodium or no salt added. Buy fresh foods. Avoid canned  foods and pre-made or frozen meals. Cooking Try not to add salt when you cook. Use salt-free seasonings or herbs instead of table salt or sea salt. Check with your health care provider or pharmacist before using salt substitutes. Do not fry foods. Cook foods in healthy ways, such as baking, boiling, grilling, roasting, or broiling. Cook using oils that are good for your heart. These include olive, canola, avocado, soybean, and sunflower oil. Meal planning  Eat a balanced diet. This should include: 4 or more servings of fruits and 4 or more servings of vegetables each day. Try to fill half of your plate with fruits and vegetables. 6-8 servings of whole grains each day. 6 or less servings of lean meat, poultry, or fish each day. 1 oz is 1 serving. A 3 oz (85 g) serving of meat is about the same size as the palm of your hand. One egg is 1 oz (28 g). 2-3 servings of low-fat dairy each day. One serving is 1 cup (237 mL). 1 serving of nuts, seeds, or beans 5 times each week. 2-3 servings of heart-healthy fats. Healthy fats called omega-3 fatty acids are found in foods such as walnuts, flaxseeds, fortified milks, and eggs. These fats are also found in cold-water fish, such as sardines, salmon, and mackerel. Limit how much you eat of: Canned or prepackaged foods. Food that is high in trans fat, such as fried foods. Food that is high in saturated fat, such as fatty meat. Desserts and other sweets, sugary drinks, and other foods with added sugar. Full-fat  dairy products. Do not salt foods before eating. Do not eat more than 4 egg yolks a week. Try to eat at least 2 vegetarian meals a week. Eat more home-cooked food and less restaurant, buffet, and fast food. Lifestyle When eating at a restaurant, ask if your food can be made with less salt or no salt. If you drink alcohol: Limit how much you have to: 0-1 drink a day if you are female. 0-2 drinks a day if you are female. Know how much alcohol is in  your drink. In the [place] one drink is one 12 oz bottle of beer (355 mL), one 5 oz glass of wine (148 mL), or one 1 oz glass of hard liquor (44 mL). General information Avoid eating more than 2,300 mg of salt a day. If you have hypertension, you may need to reduce your sodium intake to 1,500 mg a day. Work with your provider to stay at a healthy body weight or lose weight. Ask what the best weight range is for you. On most days of the week, get at least 30 minutes of exercise that causes your heart to beat faster. This may include walking, swimming, or biking. Work with your provider or dietitian to adjust your eating plan to meet your specific calorie needs. What foods should I eat? Fruits All fresh, dried, or frozen fruit. Canned fruits that are in their natural juice and do not have sugar added to them. Vegetables Fresh or frozen vegetables that are raw, steamed, roasted, or grilled. Low-sodium or reduced-sodium tomato and vegetable juice. Low-sodium or reduced-sodium tomato sauce and tomato paste. Low-sodium or reduced-sodium canned vegetables. Grains Whole-grain or whole-wheat bread. Whole-grain or whole-wheat pasta. Brown rice. [place]. Bulgur. Whole-grain and low-sodium cereals. Pita bread. Low-fat, low-sodium crackers. Whole-wheat flour tortillas. Meats and other proteins Skinless chicken or [place]. Ground chicken or [place]. Pork with fat trimmed off. Fish and seafood. Egg whites. Dried beans, peas, or lentils. Unsalted nuts, nut butters, and seeds. Unsalted canned beans. Lean cuts of beef with fat trimmed off. Low-sodium, lean precooked or cured meat, such as sausages or meat loaves. Dairy Low-fat (1%) or fat-free (skim) milk. Reduced-fat, low-fat, or fat-free cheeses. Nonfat, low-sodium ricotta or cottage cheese. Low-fat or nonfat yogurt. Low-fat, low-sodium cheese. Fats and oils Soft margarine without trans fats. Vegetable oil. Reduced-fat, low-fat, or light mayonnaise and salad  dressings (reduced-sodium). Canola, safflower, olive, avocado, soybean, and sunflower oils. Avocado. Seasonings and condiments Herbs. Spices. Seasoning mixes without salt. Other foods Unsalted popcorn and pretzels. Fat-free sweets. The items listed above may not be all the foods and drinks you can have. Talk to a dietitian to learn more. What foods should I avoid? Fruits Canned fruit in a light or heavy syrup. Fried fruit. Fruit in cream or butter sauce. Vegetables Creamed or fried vegetables. Vegetables in a cheese sauce. Regular canned vegetables that are not marked as low-sodium or reduced-sodium. Regular canned tomato sauce and paste that are not marked as low-sodium or reduced-sodium. Regular tomato and vegetable juices that are not marked as low-sodium or reduced-sodium. Dene. Olives. Grains Baked goods made with fat, such as croissants, muffins, or some breads. Dry pasta or rice meal packs. Meats and other proteins Fatty cuts of meat. Ribs. Fried meat. [place]. Bologna, salami, and other precooked or cured meats, such as sausages or meat loaves, that are not lean and low in sodium. Fat from the back of a pig (fatback). Bratwurst. Salted nuts and seeds. Canned beans with added salt. Canned  or smoked fish. Whole eggs or egg yolks. Chicken or [place] with skin. Dairy Whole or 2% milk, cream, and half-and-half. Whole or full-fat cream cheese. Whole-fat or sweetened yogurt. Full-fat cheese. Nondairy creamers. Whipped toppings. Processed cheese and cheese spreads. Fats and oils Butter. Stick margarine. Lard. Shortening. Ghee. Bacon fat. Tropical oils, such as coconut, palm kernel, or palm oil. Seasonings and condiments Onion salt, garlic salt, seasoned salt, table salt, and sea salt. Worcestershire sauce. Tartar sauce. Barbecue sauce. Teriyaki sauce. Soy sauce, including reduced-sodium soy sauce. Steak sauce. Canned and packaged gravies. Fish sauce. Oyster sauce. Cocktail sauce. Store-bought  horseradish. Ketchup. Mustard. Meat flavorings and tenderizers. Bouillon cubes. Hot sauces. Pre-made or packaged marinades. Pre-made or packaged taco seasonings. Relishes. Regular salad dressings. Other foods Salted popcorn and pretzels. The items listed above may not be all the foods and drinks you should avoid. Talk to a dietitian to learn more. Where to find more information National Heart, Lung, and Blood Institute (NHLBI): BuffaloDryCleaner.gl American Heart Association (AHA): heart.org Academy of Nutrition and Dietetics: eatright.org National Kidney Foundation (NKF): kidney.org This information is not intended to replace advice given to you by your health care provider. Make sure you discuss any questions you have with your health care provider. Document Revised: 01/15/2022 Document Reviewed: 01/15/2022 Elsevier Patient Education  2024 [employer].

## 2024-02-01 ENCOUNTER — Ambulatory Visit: Admitting: Nurse Practitioner

## 2024-02-01 ENCOUNTER — Encounter: Payer: Self-pay | Admitting: Nurse Practitioner

## 2024-02-01 VITALS — BP 156/95 | HR 79 | Temp 98.6°F | Ht 63.6 in | Wt 221.8 lb

## 2024-02-01 DIAGNOSIS — E559 Vitamin D deficiency, unspecified: Secondary | ICD-10-CM | POA: Diagnosis not present

## 2024-02-01 DIAGNOSIS — E669 Obesity, unspecified: Secondary | ICD-10-CM | POA: Diagnosis not present

## 2024-02-01 DIAGNOSIS — I10 Essential (primary) hypertension: Secondary | ICD-10-CM | POA: Diagnosis not present

## 2024-02-01 DIAGNOSIS — F419 Anxiety disorder, unspecified: Secondary | ICD-10-CM

## 2024-02-01 DIAGNOSIS — Z0000 Encounter for general adult medical examination without abnormal findings: Secondary | ICD-10-CM | POA: Diagnosis not present

## 2024-02-01 DIAGNOSIS — E782 Mixed hyperlipidemia: Secondary | ICD-10-CM | POA: Diagnosis not present

## 2024-02-01 LAB — MICROALBUMIN, URINE WAIVED
Creatinine, Urine Waived: 100 mg/dL (ref 10–300)
Microalb, Ur Waived: 10 mg/L (ref 0–19)
Microalb/Creat Ratio: <30 mg/g

## 2024-02-01 MED ORDER — VALSARTAN 40 MG PO TABS: 40.0000 mg | ORAL_TABLET | Freq: Every day | ORAL | 3 refills | Status: DC

## 2024-02-01 MED ORDER — VALSARTAN 40 MG PO TABS: 40.0000 mg | ORAL_TABLET | Freq: Every day | ORAL | 3 refills | Status: AC

## 2024-02-01 NOTE — Assessment & Plan Note (Signed)
 Ongoing on past labs. Recheck on labs today. Continue focus on diet and regular exercise. The ASCVD Risk score (Arnett DK, et al., 2019) failed to calculate for the following reasons:   The 2019 ASCVD risk score is only valid for ages 30 to 71

## 2024-02-01 NOTE — Progress Notes (Signed)
 "  BP (!) 156/95   Pulse 79   Temp 98.6 F (37 C) (Oral)   Ht 5' 3.6 (1.615 m)   Wt 221 lb 12.8 oz (100.6 kg)   LMP  (LMP Unknown)   SpO2 96%   BMI 38.55 kg/m    Subjective:    Patient ID: Allison Valdez, female    DOB: [birth year], 39 y.o.   MRN: [id number]  HPI: Allison Valdez is a 39 y.o. female presenting on 02/01/2024 for comprehensive medical examination. Current medical complaints include:none  She currently lives with: family Menopausal Symptoms: no  HYPERTENSION without Chronic Kidney Disease Takes Lisinopril  20 MG daily and Zepbound 10 MG weekly for weight loss (compounded). Hypertension status: stable  Satisfied with current treatment? yes Duration of hypertension: chronic BP monitoring frequency:  a few times a week BP range: 150/90 range BP medication side effects:  no Medication compliance: good compliance Aspirin: no Recurrent headaches: yes Visual changes: no Palpitations: no Dyspnea: no Chest pain: no Lower extremity edema: no Dizzy/lightheaded: no The ASCVD Risk score (Arnett DK, et al., 2019) failed to calculate for the following reasons:   The 2019 ASCVD risk score is only valid for ages 15 to 8   ANXIETY Taking Wellbutrin  XL 300 MG, Lexapro  10 MG daily, and Trazodone  75 MG PRN at night. Low Vitamin D  past labs. Mood status: stable Satisfied with current treatment?: yes Symptom severity: mild  Duration of current treatment : chronic Side effects: no Medication compliance: good compliance Psychotherapy/counseling: none Depressed mood: no Anxious mood: no Anhedonia: no Significant weight loss or gain: no Insomnia: stable with Trazodone  Fatigue: no Feelings of worthlessness or guilt: no Impaired concentration/indecisiveness: no Suicidal ideations: no Hopelessness: no Crying spells: no    02/01/2024    9:07 AM 12/02/2023    9:42 AM 10/19/2023    4:31 PM 07/09/2023    3:39 PM 11/17/2022    9:13 AM  Depression screen PHQ 2/9   Decreased Interest 0 0 0 0 0  Down, Depressed, Hopeless 0 0 0 0 0  PHQ - 2 Score 0 0 0 0 0  Altered sleeping 0 0 0 2   Tired, decreased energy 0 1 1 1    Change in appetite 1 1 3  0   Feeling bad or failure about yourself  0 0 0 0   Trouble concentrating 0 0 2 0   Moving slowly or fidgety/restless 0 0 0 0   Suicidal thoughts 0 0 0 0   PHQ-9 Score 1 2 6  3     Difficult doing work/chores Not difficult at all  Somewhat difficult Not difficult at all      Data saved with a previous flowsheet row definition      02/01/2024    9:07 AM 12/02/2023    9:42 AM 07/09/2023    3:40 PM  GAD 7 : Generalized Anxiety Score  Nervous, Anxious, on Edge 1 0  3   Control/stop worrying 0 0  3   Worry too much - different things 0 0  3   Trouble relaxing 1 0  3   Restless 0 0  3   Easily annoyed or irritable 0 0  3   Afraid - awful might happen 0 0  2   Total GAD 7 Score 2 0 20  Anxiety Difficulty Not difficult at all  Very difficult     Data saved with a previous flowsheet row definition  09/20/2020    8:43 AM 09/22/2021    3:06 PM 11/17/2022    9:13 AM 12/02/2023    9:42 AM 02/01/2024    9:07 AM  Fall Risk  Falls in the past year?  0 0 0 0  Was there an injury with Fall?  0   0  0  Fall Risk Category Calculator  0  0 0  Fall Risk Category (Retired)  Low      (RETIRED) Patient Fall Risk Level Low fall risk  Low fall risk      Patient at Risk for Falls Due to  No Fall Risks  No Fall Risks No Fall Risks  Fall risk Follow up  Falls evaluation completed  Falls evaluation completed Falls evaluation completed Falls evaluation completed     Data saved with a previous flowsheet row definition    Past Medical History:  Past Medical History:  Diagnosis Date   Anxiety    Depression    Hypertension    Obesity    Vitamin D  deficiency disease     Surgical History:  Past Surgical History:  Procedure Laterality Date   OVARIAN CYST REMOVAL Right    SPINE SURGERY  02/17/21   2/23 lumbar fusion     Medications:  Current Outpatient Medications on File Prior to Visit  Medication Sig   buPROPion  (WELLBUTRIN  XL) 300 MG 24 hr tablet Take 1 tablet (300 mg total) by mouth daily.   escitalopram  (LEXAPRO ) 10 MG tablet Take 1 tablet (10 mg total) by mouth daily.   levonorgestrel  (MIRENA ) 20 MCG/24HR IUD 1 each by Intrauterine route once.   Tirzepatide-Weight Management (ZEPBOUND Elmo) Inject 10 mg into the skin once a week. Compounded medicine   traZODone  (DESYREL ) 50 MG tablet Take 1.5 tablets (75 mg total) by mouth at bedtime as needed.   No current facility-administered medications on file prior to visit.    Allergies:  Allergies[1]  Social History:  Social History   Socioeconomic History   Marital status: Married    Spouse name: Not on file   Number of children: Not on file   Years of education: Not on file   Highest education level: Bachelor's degree (e.g., BA, AB, BS)  Occupational History   Not on file  Tobacco Use   Smoking status: Never   Smokeless tobacco: Never  Vaping Use   Vaping status: Never Used  Substance and Sexual Activity   Alcohol use: Yes    Comment: occas   Drug use: No   Sexual activity: Yes    Birth control/protection: I.U.D.    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   Social Drivers of Health   Tobacco Use: Low Risk (02/01/2024)   Patient History    Smoking Tobacco Use: Never    Smokeless Tobacco Use: Never    Passive Exposure: Not on file  Financial Resource Strain: Low Risk (11/28/2023)   Overall Financial Resource Strain (CARDIA)    Difficulty of Paying Living Expenses: Not very hard  Food Insecurity: No Food Insecurity (11/28/2023)   Epic    Worried About Programme Researcher, Broadcasting/film/video in the Last Year: Never true    Ran Out of Food in the Last Year: Never true  Transportation Needs: No Transportation Needs (11/28/2023)   Epic    Lack of Transportation (Medical):  No    Lack of Transportation (Non-Medical): No  Physical  Activity: Inactive (11/28/2023)   Exercise Vital Sign    Days of Exercise per Week: 0 days    Minutes of Exercise per Session: Not on file  Stress: Stress Concern Present (11/28/2023)   [employer] of Occupational Health - Occupational Stress Questionnaire    Feeling of Stress: To some extent  Social Connections: Moderately Isolated (11/28/2023)   Social Connection and Isolation Panel    Frequency of Communication with Friends and Family: More than three times a week    Frequency of Social Gatherings with Friends and Family: Once a week    Attends Religious Services: Never    Database Administrator or Organizations: No    Attends Engineer, Structural: Not on file    Marital Status: Married  Catering Manager Violence: Not At Risk (12/02/2023)   Epic    Fear of Current or Ex-Partner: No    Emotionally Abused: No    Physically Abused: No    Sexually Abused: No  Depression (PHQ2-9): Low Risk (02/01/2024)   Depression (PHQ2-9)    PHQ-2 Score: 1  Alcohol Screen: Low Risk (11/28/2023)   Alcohol Screen    Last Alcohol Screening Score (AUDIT): 6  Housing: Low Risk (11/28/2023)   Epic    Unable to Pay for Housing in the Last Year: No    Number of Times Moved in the Last Year: 0    Homeless in the Last Year: No  Utilities: Not At Risk (12/02/2023)   Epic    Threatened with loss of utilities: No  Health Literacy: Adequate Health Literacy (12/02/2023)   B1300 Health Literacy    Frequency of need for help with medical instructions: Never   Tobacco Use History[2] Social History   Substance and Sexual Activity  Alcohol Use Yes   Comment: occas    Family History:  Family History  Problem Relation Age of Onset   Diabetes Mother    Hypertension Mother    Cancer Father        Liver Cancer   Diabetes Father    Hypertension Father    Hepatitis C Father    Healthy Brother    Diabetes Maternal Grandmother     Heart attack Maternal Grandfather    Birth defects Paternal Grandmother        Breast    Past medical history, surgical history, medications, allergies, family history and social history reviewed with patient today and changes made to appropriate areas of the chart.   ROS All other ROS negative except what is listed above and in the HPI.      Objective:    BP (!) 156/95   Pulse 79   Temp 98.6 F (37 C) (Oral)   Ht 5' 3.6 (1.615 m)   Wt 221 lb 12.8 oz (100.6 kg)   LMP  (LMP Unknown)   SpO2 96%   BMI 38.55 kg/m   Wt Readings from Last 3 Encounters:  02/01/24 221 lb 12.8 oz (100.6 kg)  12/02/23 213 lb 12.8 oz (97 kg)  10/19/23 219 lb 6.4 oz (99.5 kg)    Physical Exam Vitals and nursing note reviewed. Exam conducted with a chaperone present.  Constitutional:      General: She is awake. She is not in acute distress.    Appearance: She is well-developed and well-groomed. She is obese. She is not ill-appearing or toxic-appearing.  HENT:     Head: Normocephalic and atraumatic.     Right Ear: Hearing, tympanic membrane, ear canal and  external ear normal. No drainage.     Left Ear: Hearing, tympanic membrane, ear canal and external ear normal. No drainage.     Nose: Nose normal.     Right Sinus: No maxillary sinus tenderness or frontal sinus tenderness.     Left Sinus: No maxillary sinus tenderness or frontal sinus tenderness.     Mouth/Throat:     Mouth: Mucous membranes are moist.     Pharynx: Oropharynx is clear. Uvula midline. No pharyngeal swelling, oropharyngeal exudate or posterior oropharyngeal erythema.  Eyes:     General: Lids are normal.        Right eye: No discharge.        Left eye: No discharge.     Extraocular Movements: Extraocular movements intact.     Conjunctiva/sclera: Conjunctivae normal.     Pupils: Pupils are equal, round, and reactive to light.     Visual Fields: Right eye visual fields normal and left eye visual fields normal.  Neck:     Thyroid :  No thyromegaly.     Vascular: No carotid bruit.     Trachea: Trachea normal.  Cardiovascular:     Rate and Rhythm: Normal rate and regular rhythm.     Heart sounds: Normal heart sounds. No murmur heard.    No gallop.  Pulmonary:     Effort: Pulmonary effort is normal. No accessory muscle usage or respiratory distress.     Breath sounds: Normal breath sounds. No decreased breath sounds, wheezing or rales.  Chest:  Breasts:    Right: Normal.     Left: Normal.  Abdominal:     General: Bowel sounds are normal.     Palpations: Abdomen is soft. There is no hepatomegaly or splenomegaly.     Tenderness: There is no abdominal tenderness.  Musculoskeletal:        General: Normal range of motion.     Cervical back: Normal range of motion and neck supple.     Right lower leg: No edema.     Left lower leg: No edema.  Lymphadenopathy:     Head:     Right side of head: No submental, submandibular, tonsillar, preauricular or posterior auricular adenopathy.     Left side of head: No submental, submandibular, tonsillar, preauricular or posterior auricular adenopathy.     Cervical: No cervical adenopathy.     Upper Body:     Right upper body: No supraclavicular, axillary or pectoral adenopathy.     Left upper body: No supraclavicular, axillary or pectoral adenopathy.  Skin:    General: Skin is warm and dry.     Capillary Refill: Capillary refill takes less than 2 seconds.     Findings: No rash.  Neurological:     Mental Status: She is alert and oriented to person, place, and time.     Gait: Gait is intact.     Deep Tendon Reflexes: Reflexes are normal and symmetric.     Reflex Scores:      Brachioradialis reflexes are 2+ on the right side and 2+ on the left side.      Patellar reflexes are 2+ on the right side and 2+ on the left side. Psychiatric:        Attention and Perception: Attention normal.        Mood and Affect: Mood normal.        Speech: Speech normal.        Behavior: Behavior  normal. Behavior is cooperative.  Thought Content: Thought content normal.        Judgment: Judgment normal.     Results for orders placed or performed in visit on 10/28/23  [facility] Molecular Screen   Collection Time: 11/26/23  4:28 PM  Result Value Ref Range   Genetic Analysis Overall Interpretation Negative    Genetic Disease Assessed      This is a screening test and does not detect all pathogenic or likely pathogenic variant(s) in the tested genes; diagnostic testing is recommended for individuals with a personal or family history of heart disease or hereditary cancer. [facility]  Population Screen is a screening test that analyzes 11 genes related to hereditary breast and ovarian cancer (HBOC) syndrome, Lynch syndrome, and familial hypercholesterolemia. This test only reports clinically significant pathogenic and likely  pathogenic variants but does not report variants of uncertain significance (VUS). In addition, analysis of the PMS2 gene excludes exons 11-15, which overlap with a known pseudogene (PMS2CL).    Genetic Analysis Report      No pathogenic or likely pathogenic variants were detected in the genes analyzed by this test.Genetic test results should be interpreted in the context of an individual's personal medical and family history. Alteration to medical management is NOT  recommended based solely on this result. Clinical correlation is advised.Additional Considerations- This is a screening test; individuals may still carry pathogenic or likely pathogenic variant(s) in the tested genes that are not detected by this test.-  For individuals at risk for these or other related conditions based on factors including personal or family history, diagnostic testing is recommended.- The absence of pathogenic or likely pathogenic variant(s) in the analyzed genes, while reassuring,  does not eliminate the possibility of a hereditary condition; there are other variants and genes  associated with heart disease and hereditary cancer that are not included in this test.    Genes Tested See Notes    Disclaimer See Notes    Sequencing Location See Notes    Interpretation Methods and Limitations See Notes       Assessment & Plan:   Problem List Items Addressed This Visit       Cardiovascular and Mediastinum   Hypertension - Primary   Chronic, ongoing. Elevated in office and at home. At this time will stop Lisinopril  and change to Valsartan  40 MG daily which may offer better control overall, educated her on this change. Will adjust further as needed. Recommend she monitor BP at least a few mornings a week at home and document.  DASH diet at home. Labs today: CBC, CMP, TSH, urine ALB.       Relevant Medications   valsartan  (DIOVAN ) 40 MG tablet   Other Relevant Orders   Microalbumin, Urine Waived   CBC with Differential/Platelet   Comprehensive metabolic panel with GFR   TSH     Other   Vitamin D  deficiency   Noted on past labs, discussed with patient today. Recommend she start Vitamin D3 2000 units daily. Check level today.      Relevant Orders   VITAMIN D  25 Hydroxy (Vit-D Deficiency, Fractures)   Obesity (BMI 30-39.9)   BMI 37.18, will continue Zepbound from online which is offering benefit. We also discussed Wegovy pills which may be more cost effective for her. Recommended eating smaller high protein, low fat meals more frequently and exercising 30 mins a day 5 times a week with a goal of 10-15lb weight loss in the next 3 months. Patient voiced their understanding  and motivation to adhere to these recommendations.       Moderate mixed hyperlipidemia not requiring statin therapy   Ongoing on past labs. Recheck on labs today. Continue focus on diet and regular exercise. The ASCVD Risk score (Arnett DK, et al., 2019) failed to calculate for the following reasons:   The 2019 ASCVD risk score is only valid for ages 60 to 26       Relevant Medications    valsartan  (DIOVAN ) 40 MG tablet   Other Relevant Orders   Comprehensive metabolic panel with GFR   Lipid Panel w/o Chol/HDL Ratio   Anxiety   Chronic, stable with current regimen. Denies SI/HI. Continue current medication regimen and adjust as needed.      Other Visit Diagnoses       Encounter for annual physical exam       Annual physical today with labs and health maintenance reviewed, discussed with patient.        Follow up plan: Return in about 4 weeks (around 02/29/2024) for HTN -- stopped Lisinopril  and started Valsartan  40 MG on 02/01/24.   LABORATORY TESTING:  - Pap smear: up to date  IMMUNIZATIONS:   - Tdap: Tetanus vaccination status reviewed: Refuses. - Influenza: Refused - Pneumovax: Not applicable - Prevnar: Not applicable - COVID: Not applicable - HPV: Not applicable - Shingrix vaccine: Not applicable  SCREENING: -Mammogram: Not applicable  - Colonoscopy: Not applicable  - Bone Density: Not applicable  -Hearing Test: Not applicable  -Spirometry: Not applicable   PATIENT COUNSELING:   Advised to take 1 mg of folate supplement per day if capable of pregnancy.   Sexuality: Discussed sexually transmitted diseases, partner selection, use of condoms, avoidance of unintended pregnancy  and contraceptive alternatives.   Advised to avoid cigarette smoking.  I discussed with the patient that most people either abstain from alcohol or drink within safe limits (<=14/week and <=4 drinks/occasion for males, <=7/weeks and <= 3 drinks/occasion for females) and that the risk for alcohol disorders and other health effects rises proportionally with the number of drinks per week and how often a drinker exceeds daily limits.  Discussed cessation/primary prevention of drug use and availability of treatment for abuse.   Diet: Encouraged to adjust caloric intake to maintain  or achieve ideal body weight, to reduce intake of dietary saturated fat and total fat, to limit sodium  intake by avoiding high sodium foods and not adding table salt, and to maintain adequate dietary potassium and calcium preferably from fresh fruits, vegetables, and low-fat dairy products.    stressed the importance of regular exercise  Injury prevention: Discussed safety belts, safety helmets, smoke detector, smoking near bedding or upholstery.   Dental health: Discussed importance of regular tooth brushing, flossing, and dental visits.    NEXT PREVENTATIVE PHYSICAL DUE IN 1 YEAR. Return in about 4 weeks (around 02/29/2024) for HTN -- stopped Lisinopril  and started Valsartan  40 MG on 02/01/24.               [1] No Known Allergies [2]  Social History Tobacco Use  Smoking Status Never  Smokeless Tobacco Never   "

## 2024-02-01 NOTE — Assessment & Plan Note (Signed)
 Chronic, ongoing. Elevated in office and at home. At this time will stop Lisinopril  and change to Valsartan  40 MG daily which may offer better control overall, educated her on this change. Will adjust further as needed. Recommend she monitor BP at least a few mornings a week at home and document.  DASH diet at home. Labs today: CBC, CMP, TSH, urine ALB.

## 2024-02-01 NOTE — Assessment & Plan Note (Signed)
 Noted on past labs, discussed with patient today. Recommend she start Vitamin D3 2000 units daily. Check level today.

## 2024-02-01 NOTE — Assessment & Plan Note (Signed)
 Chronic, stable with current regimen. Denies SI/HI. Continue current medication regimen and adjust as needed.

## 2024-02-01 NOTE — Assessment & Plan Note (Signed)
 BMI 37.18, will continue Zepbound from online which is offering benefit. We also discussed Wegovy pills which may be more cost effective for her. Recommended eating smaller high protein, low fat meals more frequently and exercising 30 mins a day 5 times a week with a goal of 10-15lb weight loss in the next 3 months. Patient voiced their understanding and motivation to adhere to these recommendations.

## 2024-02-02 ENCOUNTER — Ambulatory Visit: Payer: Self-pay | Admitting: Nurse Practitioner

## 2024-02-02 DIAGNOSIS — F419 Anxiety disorder, unspecified: Secondary | ICD-10-CM

## 2024-02-02 LAB — CBC WITH DIFFERENTIAL/PLATELET
Basophils Absolute: 0 x10E3/uL (ref 0.0–0.2)
Basos: 1 %
EOS (ABSOLUTE): 0 x10E3/uL (ref 0.0–0.4)
Eos: 1 %
Hematocrit: 42.9 % (ref 34.0–46.6)
Hemoglobin: 14.2 g/dL (ref 11.1–15.9)
Immature Grans (Abs): 0 x10E3/uL (ref 0.0–0.1)
Immature Granulocytes: 0 %
Lymphocytes Absolute: 1.5 x10E3/uL (ref 0.7–3.1)
Lymphs: 27 %
MCH: 32.2 pg (ref 26.6–33.0)
MCHC: 33.1 g/dL (ref 31.5–35.7)
MCV: 97 fL (ref 79–97)
Monocytes Absolute: 0.4 x10E3/uL (ref 0.1–0.9)
Monocytes: 7 %
Neutrophils Absolute: 3.5 x10E3/uL (ref 1.4–7.0)
Neutrophils: 64 %
Platelets: 222 x10E3/uL (ref 150–450)
RBC: 4.41 x10E6/uL (ref 3.77–5.28)
RDW: 12.4 % (ref 11.7–15.4)
WBC: 5.5 x10E3/uL (ref 3.4–10.8)

## 2024-02-02 LAB — COMPREHENSIVE METABOLIC PANEL WITH GFR
ALT: 22 IU/L (ref 0–32)
AST: 19 IU/L (ref 0–40)
Albumin: 4.2 g/dL (ref 3.9–4.9)
Alkaline Phosphatase: 55 IU/L (ref 41–116)
BUN/Creatinine Ratio: 13 (ref 9–23)
BUN: 11 mg/dL (ref 6–20)
Bilirubin Total: 0.4 mg/dL (ref 0.0–1.2)
CO2: 20 mmol/L (ref 20–29)
Calcium: 9.2 mg/dL (ref 8.7–10.2)
Chloride: 102 mmol/L (ref 96–106)
Creatinine, Ser: 0.83 mg/dL (ref 0.57–1.00)
Globulin, Total: 2.4 g/dL (ref 1.5–4.5)
Glucose: 84 mg/dL (ref 70–99)
Potassium: 4.2 mmol/L (ref 3.5–5.2)
Sodium: 137 mmol/L (ref 134–144)
Total Protein: 6.6 g/dL (ref 6.0–8.5)
eGFR: 92 mL/min/1.73

## 2024-02-02 LAB — TSH: TSH: 2.55 u[IU]/mL (ref 0.450–4.500)

## 2024-02-02 LAB — LIPID PANEL W/O CHOL/HDL RATIO
Cholesterol, Total: 199 mg/dL (ref 100–199)
HDL: 69 mg/dL
LDL Chol Calc (NIH): 116 mg/dL — ABNORMAL HIGH (ref 0–99)
Triglycerides: 78 mg/dL (ref 0–149)
VLDL Cholesterol Cal: 14 mg/dL (ref 5–40)

## 2024-02-02 LAB — VITAMIN D 25 HYDROXY (VIT D DEFICIENCY, FRACTURES): Vit D, 25-Hydroxy: 15.9 ng/mL — ABNORMAL LOW (ref 30.0–100.0)

## 2024-02-02 NOTE — Progress Notes (Signed)
 Contacted via MyChart  Good morning [name], your labs have returned and overall look great with exception of mild elevation in LDL, bad cholesterol, for this no medication is needed at this time. Focus on healthy diet changes and regular exercise. Any questions? Keep being stellar!!  Thank you for allowing me to participate in your care.  I appreciate you. Kindest regards, Bird Swetz

## 2024-02-15 ENCOUNTER — Other Ambulatory Visit: Payer: Self-pay | Admitting: Family Medicine

## 2024-02-15 MED ORDER — ESCITALOPRAM OXALATE 10 MG PO TABS: 10.0000 mg | ORAL_TABLET | Freq: Every day | ORAL | 3 refills | Status: AC

## 2024-03-08 ENCOUNTER — Ambulatory Visit: Admitting: Nurse Practitioner
